# Patient Record
Sex: Female | Born: 1980 | Race: White | Hispanic: No | Marital: Married | Smoking: Never smoker
Health system: Southern US, Community
[De-identification: ages and names within clinical notes are randomized; demographics above are authoritative.]

## PROBLEM LIST (undated history)

## (undated) HISTORY — PX: DILATION AND CURETTAGE OF UTERUS: SHX78

## (undated) HISTORY — PX: CHOLECYSTECTOMY: SHX55

---
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
## 1999-06-21 ENCOUNTER — Other Ambulatory Visit: Admission: RE | Admit: 1999-06-21 | Discharge: 1999-06-21 | Payer: Self-pay | Admitting: Obstetrics and Gynecology

## 2000-07-19 ENCOUNTER — Other Ambulatory Visit: Admission: RE | Admit: 2000-07-19 | Discharge: 2000-07-19 | Payer: Self-pay | Admitting: Obstetrics and Gynecology

## 2000-08-30 ENCOUNTER — Encounter: Admission: RE | Admit: 2000-08-30 | Discharge: 2000-08-30 | Payer: Self-pay | Admitting: Family Medicine

## 2000-08-30 ENCOUNTER — Encounter: Payer: Self-pay | Admitting: Family Medicine

## 2001-07-24 ENCOUNTER — Other Ambulatory Visit: Admission: RE | Admit: 2001-07-24 | Discharge: 2001-07-24 | Payer: Self-pay | Admitting: Obstetrics and Gynecology

## 2002-05-21 ENCOUNTER — Encounter: Admission: RE | Admit: 2002-05-21 | Discharge: 2002-05-21 | Payer: Self-pay | Admitting: Family Medicine

## 2002-05-21 ENCOUNTER — Encounter: Payer: Self-pay | Admitting: Family Medicine

## 2002-07-30 ENCOUNTER — Other Ambulatory Visit: Admission: RE | Admit: 2002-07-30 | Discharge: 2002-07-30 | Payer: Self-pay | Admitting: Obstetrics and Gynecology

## 2003-04-09 ENCOUNTER — Encounter: Admission: RE | Admit: 2003-04-09 | Discharge: 2003-04-09 | Payer: Self-pay | Admitting: Family Medicine

## 2003-07-28 ENCOUNTER — Ambulatory Visit (HOSPITAL_COMMUNITY): Admission: RE | Admit: 2003-07-28 | Discharge: 2003-07-28 | Payer: Self-pay | Admitting: General Surgery

## 2003-09-18 ENCOUNTER — Other Ambulatory Visit: Admission: RE | Admit: 2003-09-18 | Discharge: 2003-09-18 | Payer: Self-pay | Admitting: Obstetrics and Gynecology

## 2004-11-16 ENCOUNTER — Other Ambulatory Visit: Admission: RE | Admit: 2004-11-16 | Discharge: 2004-11-16 | Payer: Self-pay | Admitting: Obstetrics and Gynecology

## 2005-01-30 ENCOUNTER — Ambulatory Visit (HOSPITAL_COMMUNITY): Admission: RE | Admit: 2005-01-30 | Discharge: 2005-01-30 | Payer: Self-pay | Admitting: Obstetrics and Gynecology

## 2005-02-08 ENCOUNTER — Ambulatory Visit (HOSPITAL_COMMUNITY): Admission: AD | Admit: 2005-02-08 | Discharge: 2005-02-08 | Payer: Self-pay | Admitting: Obstetrics and Gynecology

## 2006-02-15 ENCOUNTER — Inpatient Hospital Stay (HOSPITAL_COMMUNITY): Admission: AD | Admit: 2006-02-15 | Discharge: 2006-02-15 | Payer: Self-pay | Admitting: Obstetrics and Gynecology

## 2006-03-12 ENCOUNTER — Inpatient Hospital Stay (HOSPITAL_COMMUNITY): Admission: AD | Admit: 2006-03-12 | Discharge: 2006-03-14 | Payer: Self-pay | Admitting: Obstetrics and Gynecology

## 2010-08-03 ENCOUNTER — Emergency Department (HOSPITAL_BASED_OUTPATIENT_CLINIC_OR_DEPARTMENT_OTHER)
Admission: EM | Admit: 2010-08-03 | Discharge: 2010-08-03 | Disposition: A | Discharge status: Home / Self Care | Payer: Self-pay | Attending: Emergency Medicine | Admitting: Emergency Medicine

## 2010-08-03 DIAGNOSIS — L03317 Cellulitis of buttock: Secondary | ICD-10-CM

## 2010-08-03 DIAGNOSIS — L0231 Cutaneous abscess of buttock: Secondary | ICD-10-CM | POA: Insufficient documentation

## 2010-08-03 MED ORDER — DOXYCYCLINE HYCLATE 100 MG PO CAPS: 100.0000 mg | ORAL_CAPSULE | Freq: Two times a day (BID) | ORAL | Status: AC

## 2010-08-03 MED ORDER — HYDROCODONE-ACETAMINOPHEN 5-325 MG PO TABS: 2.0000 | ORAL_TABLET | ORAL | Status: AC | PRN

## 2010-08-03 NOTE — ED Notes (Signed)
MD at bedside. 

## 2010-08-03 NOTE — ED Notes (Signed)
Lesion noted on right buttock.  Redness, tenderness and drainage is present. Onset 2 days ago.

## 2010-08-03 NOTE — ED Provider Notes (Signed)
History     Chief Complaint  Patient presents with  . Recurrent Skin Infections   HPI Comments: Pain increases with movement and palpation. Patient states she put some warm compresses on it last night and drained following that. Her husband has had MRSA before but she has never had that in the past.  Patient is a 30 y.o. female presenting with abscess. The history is provided by the patient.  Abscess  This is a new problem. Episode onset: two days ago. The onset was gradual. The problem has been gradually worsening. The abscess is present on the right buttock. The problem is moderate. The abscess is characterized by painfulness. Pertinent negatives include no anorexia, no fever and no diarrhea. There were no sick contacts.    No past medical history on file.  Past Surgical History  Procedure Date  . Cholecystectomy   . Dilation and curettage of uterus     No family history on file.  History  Substance Use Topics  . Smoking status: Never Smoker   . Smokeless tobacco: Not on file  . Alcohol Use: No    OB History    Grav Para Term Preterm Abortions TAB SAB Ect Mult Living                  Review of Systems  Constitutional: Negative for fever.  Gastrointestinal: Negative for diarrhea and anorexia.    Physical Exam  BP 118/70  Pulse 73  Temp(Src) 98.5 F (36.9 C) (Oral)  Resp 20  Ht 5\' 7"  (1.702 m)  Wt 280 lb (127.007 kg)  BMI 43.85 kg/m2  SpO2 99%  Physical Exam  Constitutional: She appears well-developed and well-nourished. No distress.  HENT:  Head: Normocephalic and atraumatic.  Right Ear: External ear normal.  Left Ear: External ear normal.  Eyes: Conjunctivae are normal. Right eye exhibits no discharge. Left eye exhibits no discharge. No scleral icterus.  Neck: Neck supple. No tracheal deviation present.  Pulmonary/Chest: Effort normal. No stridor. No respiratory distress.  Musculoskeletal: She exhibits no edema.       Right inferior buttock there is an  area of erythema and induration. Posterior one 2 cm in size. Small scab centrally. No active drainage.  Neurological: She is alert. Cranial nerve deficit: no gross deficits.  Skin: Skin is warm and dry. No rash noted.  Psychiatric: She has a normal mood and affect.    ED Course  Procedures Brief bedside ED ultrasound was performed and there was no evidence of fluid collection subcutaneously near the area of induration of the skin. No drainable abscess noted. MDM Patient does have a small area that was likely a small abscess however started spontaneously drained. We'll have her continue warm compresses and will give a prescription for doxycycline. We'll have her followup with her primary doctor if the symptoms persist in 2 days.      Celene Kras, MD 08/03/10 1200

## 2010-08-03 NOTE — ED Notes (Signed)
Open area noted to right buttock that started 2 days ago.  Site is painful to touch, draining purulent drainage and induration present.

## 2011-04-06 ENCOUNTER — Other Ambulatory Visit: Payer: Self-pay | Admitting: Obstetrics and Gynecology

## 2013-11-06 ENCOUNTER — Other Ambulatory Visit: Payer: Self-pay | Admitting: Family Medicine

## 2013-11-06 ENCOUNTER — Ambulatory Visit
Admission: RE | Admit: 2013-11-06 | Discharge: 2013-11-06 | Disposition: A | Discharge status: Home / Self Care | Payer: 59 | Source: Ambulatory Visit | Attending: Family Medicine | Admitting: Family Medicine

## 2013-11-06 DIAGNOSIS — M79605 Pain in left leg: Secondary | ICD-10-CM

## 2014-05-13 ENCOUNTER — Other Ambulatory Visit: Payer: Self-pay | Admitting: Obstetrics and Gynecology

## 2014-05-14 LAB — CYTOLOGY - PAP

## 2015-04-01 DIAGNOSIS — M545 Low back pain: Secondary | ICD-10-CM | POA: Diagnosis not present

## 2015-05-17 DIAGNOSIS — G8929 Other chronic pain: Secondary | ICD-10-CM | POA: Diagnosis not present

## 2015-05-17 DIAGNOSIS — M5136 Other intervertebral disc degeneration, lumbar region: Secondary | ICD-10-CM | POA: Diagnosis not present

## 2015-05-17 DIAGNOSIS — M5442 Lumbago with sciatica, left side: Secondary | ICD-10-CM | POA: Diagnosis not present

## 2015-05-17 MED FILL — predniSONE 50 MG TABS: 50 | 7 days supply | Qty: 7 | Fill #0

## 2015-09-21 DIAGNOSIS — Z01411 Encounter for gynecological examination (general) (routine) with abnormal findings: Secondary | ICD-10-CM | POA: Diagnosis not present

## 2015-09-21 DIAGNOSIS — Z780 Asymptomatic menopausal state: Secondary | ICD-10-CM | POA: Diagnosis not present

## 2015-09-21 DIAGNOSIS — Z6841 Body Mass Index (BMI) 40.0 and over, adult: Secondary | ICD-10-CM | POA: Diagnosis not present

## 2015-09-21 DIAGNOSIS — L639 Alopecia areata, unspecified: Secondary | ICD-10-CM | POA: Diagnosis not present

## 2015-09-21 DIAGNOSIS — Z01419 Encounter for gynecological examination (general) (routine) without abnormal findings: Secondary | ICD-10-CM | POA: Diagnosis not present

## 2015-09-21 DIAGNOSIS — N920 Excessive and frequent menstruation with regular cycle: Secondary | ICD-10-CM | POA: Diagnosis not present

## 2015-10-11 MED FILL — MONO-LINYAH 28 TABLET: 0.25-35 | 84 days supply | Qty: 84 | Fill #0

## 2016-06-13 DIAGNOSIS — J209 Acute bronchitis, unspecified: Secondary | ICD-10-CM | POA: Diagnosis not present

## 2016-06-13 DIAGNOSIS — R062 Wheezing: Secondary | ICD-10-CM | POA: Diagnosis not present

## 2016-06-13 DIAGNOSIS — J069 Acute upper respiratory infection, unspecified: Secondary | ICD-10-CM | POA: Diagnosis not present

## 2016-06-14 MED FILL — AMOX TR-K CLV 875-125 MG TA: 875-125 | 10 days supply | Qty: 20 | Fill #0

## 2016-06-26 MED FILL — CYCLOBENZAPRINE 10 MG TABLE: 10 | 10 days supply | Qty: 30 | Fill #0

## 2016-07-18 DIAGNOSIS — L03116 Cellulitis of left lower limb: Secondary | ICD-10-CM | POA: Diagnosis not present

## 2016-07-18 MED FILL — DOXYCYCLINE HYC 100 MG TAB: 100 | 7 days supply | Qty: 14 | Fill #0

## 2019-08-05 ENCOUNTER — Other Ambulatory Visit: Payer: Self-pay

## 2019-08-05 ENCOUNTER — Inpatient Hospital Stay (HOSPITAL_COMMUNITY): Payer: Medicaid Other | Admitting: Certified Registered Nurse Anesthetist

## 2019-08-05 ENCOUNTER — Emergency Department (HOSPITAL_COMMUNITY): Payer: Medicaid Other

## 2019-08-05 ENCOUNTER — Inpatient Hospital Stay (HOSPITAL_COMMUNITY): Payer: Medicaid Other

## 2019-08-05 ENCOUNTER — Observation Stay (HOSPITAL_COMMUNITY)
Admission: EM | Admit: 2019-08-05 | Discharge: 2019-08-06 | Disposition: A | Discharge status: Home / Self Care | Payer: Medicaid Other | Attending: Neurosurgery | Admitting: Neurosurgery

## 2019-08-05 ENCOUNTER — Encounter (HOSPITAL_COMMUNITY)
Admission: EM | Disposition: A | Discharge status: Home / Self Care | Payer: Self-pay | Source: Home / Self Care | Attending: Emergency Medicine

## 2019-08-05 ENCOUNTER — Encounter (HOSPITAL_COMMUNITY): Payer: Self-pay | Admitting: Neurosurgery

## 2019-08-05 DIAGNOSIS — M5126 Other intervertebral disc displacement, lumbar region: Secondary | ICD-10-CM | POA: Diagnosis not present

## 2019-08-05 DIAGNOSIS — Z20822 Contact with and (suspected) exposure to covid-19: Secondary | ICD-10-CM | POA: Diagnosis not present

## 2019-08-05 DIAGNOSIS — Z981 Arthrodesis status: Secondary | ICD-10-CM | POA: Diagnosis not present

## 2019-08-05 DIAGNOSIS — M48061 Spinal stenosis, lumbar region without neurogenic claudication: Secondary | ICD-10-CM | POA: Diagnosis not present

## 2019-08-05 DIAGNOSIS — G8929 Other chronic pain: Secondary | ICD-10-CM | POA: Diagnosis not present

## 2019-08-05 DIAGNOSIS — R52 Pain, unspecified: Secondary | ICD-10-CM | POA: Diagnosis present

## 2019-08-05 DIAGNOSIS — Z79899 Other long term (current) drug therapy: Secondary | ICD-10-CM | POA: Diagnosis not present

## 2019-08-05 DIAGNOSIS — M545 Low back pain: Secondary | ICD-10-CM | POA: Diagnosis not present

## 2019-08-05 DIAGNOSIS — Z419 Encounter for procedure for purposes other than remedying health state, unspecified: Secondary | ICD-10-CM

## 2019-08-05 HISTORY — PX: LUMBAR LAMINECTOMY/DECOMPRESSION MICRODISCECTOMY: SHX5026

## 2019-08-05 LAB — URINALYSIS, ROUTINE W REFLEX MICROSCOPIC
Bilirubin Urine: NEGATIVE
Glucose, UA: NEGATIVE mg/dL
Hgb urine dipstick: NEGATIVE
Ketones, ur: NEGATIVE mg/dL
Nitrite: NEGATIVE
Protein, ur: NEGATIVE mg/dL
Specific Gravity, Urine: 1.027 (ref 1.005–1.030)
pH: 5 (ref 5.0–8.0)

## 2019-08-05 LAB — CBC WITH DIFFERENTIAL/PLATELET
Abs Immature Granulocytes: 0.04 10*3/uL (ref 0.00–0.07)
Basophils Absolute: 0 10*3/uL (ref 0.0–0.1)
Basophils Relative: 0 %
Eosinophils Absolute: 0 10*3/uL (ref 0.0–0.5)
Eosinophils Relative: 0 %
HCT: 36.1 % (ref 36.0–46.0)
Hemoglobin: 11.5 g/dL — ABNORMAL LOW (ref 12.0–15.0)
Immature Granulocytes: 0 %
Lymphocytes Relative: 7 %
Lymphs Abs: 0.8 10*3/uL (ref 0.7–4.0)
MCH: 29.8 pg (ref 26.0–34.0)
MCHC: 31.9 g/dL (ref 30.0–36.0)
MCV: 93.5 fL (ref 80.0–100.0)
Monocytes Absolute: 0.2 10*3/uL (ref 0.1–1.0)
Monocytes Relative: 1 %
Neutro Abs: 10 10*3/uL — ABNORMAL HIGH (ref 1.7–7.7)
Neutrophils Relative %: 92 %
Platelets: 286 10*3/uL (ref 150–400)
RBC: 3.86 MIL/uL — ABNORMAL LOW (ref 3.87–5.11)
RDW: 14.8 % (ref 11.5–15.5)
WBC: 11 10*3/uL — ABNORMAL HIGH (ref 4.0–10.5)
nRBC: 0 % (ref 0.0–0.2)

## 2019-08-05 LAB — SARS CORONAVIRUS 2 BY RT PCR (HOSPITAL ORDER, PERFORMED IN ~~LOC~~ HOSPITAL LAB): SARS Coronavirus 2: NEGATIVE

## 2019-08-05 LAB — BASIC METABOLIC PANEL
Anion gap: 7 (ref 5–15)
BUN: 12 mg/dL (ref 6–20)
CO2: 26 mmol/L (ref 22–32)
Calcium: 8.6 mg/dL — ABNORMAL LOW (ref 8.9–10.3)
Chloride: 103 mmol/L (ref 98–111)
Creatinine, Ser: 0.55 mg/dL (ref 0.44–1.00)
GFR calc Af Amer: >60 mL/min (ref >60)
GFR calc non Af Amer: >60 mL/min (ref >60)
Glucose, Bld: 107 mg/dL — ABNORMAL HIGH (ref 70–99)
Potassium: 3.7 mmol/L (ref 3.5–5.1)
Sodium: 136 mmol/L (ref 135–145)

## 2019-08-05 SURGERY — LUMBAR LAMINECTOMY/DECOMPRESSION MICRODISCECTOMY 1 LEVEL
Anesthesia: General | Site: Back

## 2019-08-05 MED ORDER — 0.9 % SODIUM CHLORIDE (POUR BTL) OPTIME
TOPICAL | Status: DC | PRN
  Administered 2019-08-05: 1000 mL

## 2019-08-05 MED ORDER — FENTANYL CITRATE (PF) 100 MCG/2ML IJ SOLN: 25.0000 ug | INTRAMUSCULAR | Status: DC | PRN

## 2019-08-05 MED ORDER — METHYLPREDNISOLONE ACETATE 80 MG/ML IJ SUSP
INTRAMUSCULAR | Status: DC | PRN
  Administered 2019-08-05: 80 mg

## 2019-08-05 MED ORDER — METHYLPREDNISOLONE ACETATE 80 MG/ML IJ SUSP
INTRAMUSCULAR | Status: AC
  Filled 2019-08-05: qty 1

## 2019-08-05 MED ORDER — BISACODYL 10 MG RE SUPP: 10.0000 mg | Freq: Every day | RECTAL | Status: DC | PRN

## 2019-08-05 MED ORDER — ALBUMIN HUMAN 5 % IV SOLN
INTRAVENOUS | Status: DC | PRN
Start: 2019-08-05 — End: 2019-08-05

## 2019-08-05 MED ORDER — BUPIVACAINE HCL (PF) 0.5 % IJ SOLN
INTRAMUSCULAR | Status: AC
  Filled 2019-08-05: qty 30

## 2019-08-05 MED ORDER — DIPHENHYDRAMINE HCL 50 MG/ML IJ SOLN
INTRAMUSCULAR | Status: DC | PRN
  Administered 2019-08-05: 6.25 mg via INTRAVENOUS

## 2019-08-05 MED ORDER — ACETAMINOPHEN 10 MG/ML IV SOLN
INTRAVENOUS | Status: DC | PRN
Start: 2019-08-05 — End: 2019-08-05
  Administered 2019-08-05: 1000 mg via INTRAVENOUS

## 2019-08-05 MED ORDER — DIAZEPAM 2 MG PO TABS
2.0000 mg | ORAL_TABLET | Freq: Once | ORAL | Status: AC
  Administered 2019-08-05: 2 mg via ORAL
  Filled 2019-08-05: qty 1

## 2019-08-05 MED ORDER — LIDOCAINE-EPINEPHRINE 1 %-1:100000 IJ SOLN
INTRAMUSCULAR | Status: DC | PRN
  Administered 2019-08-05: 5 mL via INTRADERMAL

## 2019-08-05 MED ORDER — SUGAMMADEX SODIUM 500 MG/5ML IV SOLN
INTRAVENOUS | Status: DC | PRN
  Administered 2019-08-05: 300 mg via INTRAVENOUS

## 2019-08-05 MED ORDER — METHYLPREDNISOLONE SODIUM SUCC 125 MG IJ SOLR
125.0000 mg | Freq: Once | INTRAMUSCULAR | Status: AC
  Administered 2019-08-05: 125 mg via INTRAVENOUS

## 2019-08-05 MED ORDER — FENTANYL CITRATE (PF) 100 MCG/2ML IJ SOLN
100.0000 ug | Freq: Once | INTRAMUSCULAR | Status: AC
  Administered 2019-08-05: 100 ug via INTRAVENOUS

## 2019-08-05 MED ORDER — DEXTROSE 5 % IV SOLN
3.0000 g | INTRAVENOUS | Status: DC
  Filled 2019-08-05 (×2): qty 3000

## 2019-08-05 MED ORDER — ACETAMINOPHEN 650 MG RE SUPP: 650.0000 mg | RECTAL | Status: DC | PRN

## 2019-08-05 MED ORDER — GABAPENTIN 300 MG PO CAPS
300.0000 mg | ORAL_CAPSULE | Freq: Three times a day (TID) | ORAL | Status: DC
  Administered 2019-08-06: 300 mg via ORAL
  Filled 2019-08-05: qty 1

## 2019-08-05 MED ORDER — ALBUTEROL SULFATE HFA 108 (90 BASE) MCG/ACT IN AERS
INHALATION_SPRAY | RESPIRATORY_TRACT | Status: DC | PRN
Start: 2019-08-05 — End: 2019-08-05
  Administered 2019-08-05 (×2): 4 via RESPIRATORY_TRACT

## 2019-08-05 MED ORDER — OXYCODONE-ACETAMINOPHEN 5-325 MG PO TABS
1.0000 | ORAL_TABLET | Freq: Once | ORAL | Status: AC
  Administered 2019-08-05: 1 via ORAL
  Filled 2019-08-05: qty 1

## 2019-08-05 MED ORDER — LIDOCAINE-EPINEPHRINE 1 %-1:100000 IJ SOLN
INTRAMUSCULAR | Status: AC
  Filled 2019-08-05: qty 1

## 2019-08-05 MED ORDER — METHOCARBAMOL 500 MG PO TABS
500.0000 mg | ORAL_TABLET | Freq: Once | ORAL | Status: AC
  Administered 2019-08-05: 500 mg via ORAL

## 2019-08-05 MED ORDER — MIDAZOLAM HCL 5 MG/5ML IJ SOLN
INTRAMUSCULAR | Status: DC | PRN
  Administered 2019-08-05: 2 mg via INTRAVENOUS

## 2019-08-05 MED ORDER — BACITRACIN ZINC 500 UNIT/GM EX OINT
TOPICAL_OINTMENT | CUTANEOUS | Status: AC
  Filled 2019-08-05: qty 28.35

## 2019-08-05 MED ORDER — DEXTROSE 5 % IV SOLN
INTRAVENOUS | Status: DC | PRN
  Administered 2019-08-05: 3 g via INTRAVENOUS

## 2019-08-05 MED ORDER — FENTANYL CITRATE (PF) 100 MCG/2ML IJ SOLN
INTRAMUSCULAR | Status: AC
  Administered 2019-08-05: 100 ug via INTRAVENOUS
  Filled 2019-08-05: qty 2

## 2019-08-05 MED ORDER — ONDANSETRON HCL 4 MG PO TABS: 4.0000 mg | ORAL_TABLET | Freq: Four times a day (QID) | ORAL | Status: DC | PRN

## 2019-08-05 MED ORDER — DOCUSATE SODIUM 100 MG PO CAPS
100.0000 mg | ORAL_CAPSULE | Freq: Two times a day (BID) | ORAL | Status: DC
  Administered 2019-08-06: 100 mg via ORAL
  Filled 2019-08-05: qty 1

## 2019-08-05 MED ORDER — MENTHOL 3 MG MT LOZG: 1.0000 | LOZENGE | OROMUCOSAL | Status: DC | PRN

## 2019-08-05 MED ORDER — SODIUM CHLORIDE 0.9 % IV SOLN: 250.0000 mL | INTRAVENOUS | Status: DC

## 2019-08-05 MED ORDER — FENTANYL CITRATE (PF) 250 MCG/5ML IJ SOLN
INTRAMUSCULAR | Status: DC | PRN
  Administered 2019-08-05: 150 ug via INTRAVENOUS
  Administered 2019-08-05 (×4): 50 ug via INTRAVENOUS

## 2019-08-05 MED ORDER — HYDROCODONE-ACETAMINOPHEN 5-325 MG PO TABS: 1.0000 | ORAL_TABLET | ORAL | Status: DC | PRN

## 2019-08-05 MED ORDER — ACETAMINOPHEN 325 MG PO TABS: 650.0000 mg | ORAL_TABLET | ORAL | Status: DC | PRN

## 2019-08-05 MED ORDER — METHOCARBAMOL 500 MG PO TABS
500.0000 mg | ORAL_TABLET | Freq: Four times a day (QID) | ORAL | Status: DC | PRN
  Administered 2019-08-06: 500 mg via ORAL
  Filled 2019-08-05 (×2): qty 1

## 2019-08-05 MED ORDER — FENTANYL CITRATE (PF) 100 MCG/2ML IJ SOLN
50.0000 ug | INTRAMUSCULAR | Status: DC | PRN
  Administered 2019-08-05: 50 ug via INTRAVENOUS
  Filled 2019-08-05: qty 2

## 2019-08-05 MED ORDER — ONDANSETRON HCL 4 MG/2ML IJ SOLN: 4.0000 mg | Freq: Four times a day (QID) | INTRAMUSCULAR | Status: DC | PRN

## 2019-08-05 MED ORDER — METHYLPREDNISOLONE SODIUM SUCC 125 MG IJ SOLR
INTRAMUSCULAR | Status: AC
  Administered 2019-08-05: 125 mg via INTRAVENOUS
  Filled 2019-08-05: qty 2

## 2019-08-05 MED ORDER — PHENOL 1.4 % MT LIQD: 1.0000 | OROMUCOSAL | Status: DC | PRN

## 2019-08-05 MED ORDER — SODIUM CHLORIDE 0.9% FLUSH: 3.0000 mL | Freq: Two times a day (BID) | INTRAVENOUS | Status: DC

## 2019-08-05 MED ORDER — METHOCARBAMOL 1000 MG/10ML IJ SOLN
500.0000 mg | Freq: Four times a day (QID) | INTRAVENOUS | Status: DC | PRN
  Filled 2019-08-05: qty 5

## 2019-08-05 MED ORDER — LORAZEPAM 2 MG/ML IJ SOLN
1.0000 mg | Freq: Once | INTRAMUSCULAR | Status: AC
Start: 2019-08-05 — End: 2019-08-05
  Administered 2019-08-05: 1 mg via INTRAVENOUS
  Filled 2019-08-05: qty 1

## 2019-08-05 MED ORDER — THROMBIN 5000 UNITS EX SOLR
OROMUCOSAL | Status: DC | PRN
  Administered 2019-08-05: 5 mL via TOPICAL

## 2019-08-05 MED ORDER — PROPOFOL 10 MG/ML IV BOLUS
INTRAVENOUS | Status: DC | PRN
  Administered 2019-08-05: 200 mg via INTRAVENOUS

## 2019-08-05 MED ORDER — AMISULPRIDE (ANTIEMETIC) 5 MG/2ML IV SOLN: 10.0000 mg | Freq: Once | INTRAVENOUS | Status: DC | PRN

## 2019-08-05 MED ORDER — ROCURONIUM BROMIDE 10 MG/ML (PF) SYRINGE
PREFILLED_SYRINGE | INTRAVENOUS | Status: DC | PRN
  Administered 2019-08-05: 60 mg via INTRAVENOUS
  Administered 2019-08-05: 10 mg via INTRAVENOUS

## 2019-08-05 MED ORDER — SODIUM CHLORIDE 0.9% FLUSH: 3.0000 mL | INTRAVENOUS | Status: DC | PRN

## 2019-08-05 MED ORDER — CEFAZOLIN SODIUM-DEXTROSE 2-4 GM/100ML-% IV SOLN
2.0000 g | Freq: Three times a day (TID) | INTRAVENOUS | Status: DC
  Administered 2019-08-06: 2 g via INTRAVENOUS
  Filled 2019-08-05: qty 100

## 2019-08-05 MED ORDER — LIDOCAINE 2% (20 MG/ML) 5 ML SYRINGE
INTRAMUSCULAR | Status: DC | PRN
  Administered 2019-08-05: 60 mg via INTRAVENOUS

## 2019-08-05 MED ORDER — SENNA 8.6 MG PO TABS
1.0000 | ORAL_TABLET | Freq: Two times a day (BID) | ORAL | Status: DC
  Administered 2019-08-06: 8.6 mg via ORAL
  Filled 2019-08-05: qty 1

## 2019-08-05 MED ORDER — PHENYLEPHRINE HCL-NACL 10-0.9 MG/250ML-% IV SOLN
INTRAVENOUS | Status: DC | PRN
Start: 2019-08-05 — End: 2019-08-05
  Administered 2019-08-05: 25 ug/min via INTRAVENOUS

## 2019-08-05 MED ORDER — LACTATED RINGERS IV SOLN
INTRAVENOUS | Status: DC | PRN
Start: 2019-08-05 — End: 2019-08-05

## 2019-08-05 MED ORDER — SODIUM CHLORIDE 0.9 % IV SOLN: INTRAVENOUS | Status: DC

## 2019-08-05 MED ORDER — ACETAMINOPHEN 500 MG PO TABS
1000.0000 mg | ORAL_TABLET | Freq: Four times a day (QID) | ORAL | Status: DC
  Administered 2019-08-06 (×2): 1000 mg via ORAL
  Filled 2019-08-05 (×2): qty 2

## 2019-08-05 MED ORDER — PHENYLEPHRINE 40 MCG/ML (10ML) SYRINGE FOR IV PUSH (FOR BLOOD PRESSURE SUPPORT)
PREFILLED_SYRINGE | INTRAVENOUS | Status: DC | PRN
  Administered 2019-08-05 (×4): 80 ug via INTRAVENOUS

## 2019-08-05 MED ORDER — THROMBIN 5000 UNITS EX SOLR
CUTANEOUS | Status: AC
  Filled 2019-08-05: qty 5000

## 2019-08-05 MED ORDER — LACTATED RINGERS IV SOLN: INTRAVENOUS | Status: DC

## 2019-08-05 MED ORDER — DEXAMETHASONE SODIUM PHOSPHATE 10 MG/ML IJ SOLN
INTRAMUSCULAR | Status: DC | PRN
  Administered 2019-08-05: 10 mg via INTRAVENOUS

## 2019-08-05 MED ORDER — HYDROMORPHONE HCL 1 MG/ML IJ SOLN: 0.5000 mg | INTRAMUSCULAR | Status: DC | PRN

## 2019-08-05 MED ORDER — LACTATED RINGERS IV SOLN: INTRAVENOUS | Status: DC | PRN

## 2019-08-05 MED ORDER — BUPIVACAINE HCL (PF) 0.5 % IJ SOLN
INTRAMUSCULAR | Status: DC | PRN
  Administered 2019-08-05: 5 mL

## 2019-08-05 MED ORDER — FLEET ENEMA 7-19 GM/118ML RE ENEM: 1.0000 | ENEMA | Freq: Once | RECTAL | Status: DC | PRN

## 2019-08-05 MED ORDER — GABAPENTIN 300 MG PO CAPS
300.0000 mg | ORAL_CAPSULE | Freq: Once | ORAL | Status: AC
  Administered 2019-08-05: 300 mg via ORAL
  Filled 2019-08-05: qty 1

## 2019-08-05 MED ORDER — OXYCODONE HCL 5 MG PO TABS
5.0000 mg | ORAL_TABLET | ORAL | Status: DC | PRN
  Administered 2019-08-06: 5 mg via ORAL
  Filled 2019-08-05: qty 2

## 2019-08-05 MED ORDER — ONDANSETRON HCL 4 MG/2ML IJ SOLN
INTRAMUSCULAR | Status: DC | PRN
  Administered 2019-08-05: 4 mg via INTRAVENOUS

## 2019-08-05 MED ORDER — SODIUM CHLORIDE 0.9 % IV SOLN
INTRAVENOUS | Status: DC | PRN
  Administered 2019-08-05: 500 mL

## 2019-08-05 MED ORDER — METHOCARBAMOL 500 MG PO TABS
ORAL_TABLET | ORAL | Status: AC
  Administered 2019-08-05: 500 mg via ORAL
  Filled 2019-08-05: qty 1

## 2019-08-05 MED ORDER — SENNOSIDES-DOCUSATE SODIUM 8.6-50 MG PO TABS: 1.0000 | ORAL_TABLET | Freq: Every evening | ORAL | Status: DC | PRN

## 2019-08-05 MED ORDER — HYDROMORPHONE HCL 1 MG/ML IJ SOLN
1.0000 mg | Freq: Once | INTRAMUSCULAR | Status: AC
  Administered 2019-08-05: 1 mg via INTRAVENOUS
  Filled 2019-08-05: qty 1

## 2019-08-05 MED ORDER — MORPHINE SULFATE (PF) 4 MG/ML IV SOLN
4.0000 mg | Freq: Once | INTRAVENOUS | Status: AC
  Administered 2019-08-05: 4 mg via INTRAVENOUS
  Filled 2019-08-05: qty 1

## 2019-08-05 SURGICAL SUPPLY — 64 items
ADH SKN CLS APL DERMABOND .7 (GAUZE/BANDAGES/DRESSINGS) ×1
APL SKNCLS STERI-STRIP NONHPOA (GAUZE/BANDAGES/DRESSINGS)
BAG DECANTER FOR FLEXI CONT (MISCELLANEOUS) ×3 IMPLANT
BAND INSRT 18 STRL LF DISP RB (MISCELLANEOUS) ×2
BAND RUBBER #18 3X1/16 STRL (MISCELLANEOUS) ×6 IMPLANT
BENZOIN TINCTURE PRP APPL 2/3 (GAUZE/BANDAGES/DRESSINGS) IMPLANT
BLADE CLIPPER SURG (BLADE) IMPLANT
BLADE SURG 11 STRL SS (BLADE) ×5 IMPLANT
BUR MATCHSTICK NEURO 3.0 LAGG (BURR) IMPLANT
BUR PRECISION FLUTE 5.0 (BURR) IMPLANT
CANISTER SUCT 3000ML PPV (MISCELLANEOUS) ×3 IMPLANT
CARTRIDGE OIL MAESTRO DRILL (MISCELLANEOUS) ×1 IMPLANT
CLOSURE WOUND 1/2 X4 (GAUZE/BANDAGES/DRESSINGS)
COVER WAND RF STERILE (DRAPES) ×3 IMPLANT
DECANTER SPIKE VIAL GLASS SM (MISCELLANEOUS) ×3 IMPLANT
DERMABOND ADVANCED (GAUZE/BANDAGES/DRESSINGS) ×2
DERMABOND ADVANCED .7 DNX12 (GAUZE/BANDAGES/DRESSINGS) ×1 IMPLANT
DIFFUSER DRILL AIR PNEUMATIC (MISCELLANEOUS) ×3 IMPLANT
DRAPE LAPAROTOMY 100X72X124 (DRAPES) ×3 IMPLANT
DRAPE MICROSCOPE LEICA (MISCELLANEOUS) ×3 IMPLANT
DRAPE SURG 17X23 STRL (DRAPES) ×3 IMPLANT
DRSG OPSITE POSTOP 3X4 (GAUZE/BANDAGES/DRESSINGS) ×3 IMPLANT
DURAPREP 26ML APPLICATOR (WOUND CARE) ×3 IMPLANT
ELECT REM PT RETURN 9FT ADLT (ELECTROSURGICAL) ×3
ELECTRODE REM PT RTRN 9FT ADLT (ELECTROSURGICAL) ×1 IMPLANT
GAUZE 4X4 16PLY RFD (DISPOSABLE) IMPLANT
GAUZE SPONGE 4X4 12PLY STRL (GAUZE/BANDAGES/DRESSINGS) IMPLANT
GLOVE BIO SURGEON STRL SZ 6.5 (GLOVE) ×1 IMPLANT
GLOVE BIO SURGEON STRL SZ7 (GLOVE) ×2 IMPLANT
GLOVE BIO SURGEON STRL SZ7.5 (GLOVE) IMPLANT
GLOVE BIO SURGEONS STRL SZ 6.5 (GLOVE) ×1
GLOVE BIOGEL PI IND STRL 7.5 (GLOVE) ×2 IMPLANT
GLOVE BIOGEL PI INDICATOR 7.5 (GLOVE) ×4
GLOVE ECLIPSE 7.0 STRL STRAW (GLOVE) ×3 IMPLANT
GLOVE EXAM NITRILE XL STR (GLOVE) IMPLANT
GOWN STRL REUS W/ TWL LRG LVL3 (GOWN DISPOSABLE) ×2 IMPLANT
GOWN STRL REUS W/ TWL XL LVL3 (GOWN DISPOSABLE) IMPLANT
GOWN STRL REUS W/TWL 2XL LVL3 (GOWN DISPOSABLE) IMPLANT
GOWN STRL REUS W/TWL LRG LVL3 (GOWN DISPOSABLE) ×6
GOWN STRL REUS W/TWL XL LVL3 (GOWN DISPOSABLE)
HEMOSTAT POWDER KIT SURGIFOAM (HEMOSTASIS) ×3 IMPLANT
KIT BASIN OR (CUSTOM PROCEDURE TRAY) ×3 IMPLANT
KIT TURNOVER KIT B (KITS) ×3 IMPLANT
NDL HYPO 18GX1.5 BLUNT FILL (NEEDLE) IMPLANT
NDL SPNL 18GX3.5 QUINCKE PK (NEEDLE) IMPLANT
NEEDLE HYPO 18GX1.5 BLUNT FILL (NEEDLE) ×3 IMPLANT
NEEDLE HYPO 22GX1.5 SAFETY (NEEDLE) ×3 IMPLANT
NEEDLE SPNL 18GX3.5 QUINCKE PK (NEEDLE) ×3 IMPLANT
NS IRRIG 1000ML POUR BTL (IV SOLUTION) ×3 IMPLANT
OIL CARTRIDGE MAESTRO DRILL (MISCELLANEOUS) ×3
PACK LAMINECTOMY NEURO (CUSTOM PROCEDURE TRAY) ×3 IMPLANT
PAD ARMBOARD 7.5X6 YLW CONV (MISCELLANEOUS) ×9 IMPLANT
SPONGE LAP 4X18 RFD (DISPOSABLE) IMPLANT
SPONGE SURGIFOAM ABS GEL SZ50 (HEMOSTASIS) ×3 IMPLANT
STRIP CLOSURE SKIN 1/2X4 (GAUZE/BANDAGES/DRESSINGS) IMPLANT
SUT VIC AB 0 CT1 18XCR BRD8 (SUTURE) ×1 IMPLANT
SUT VIC AB 0 CT1 8-18 (SUTURE) ×3
SUT VIC AB 2-0 CT1 18 (SUTURE) IMPLANT
SUT VIC AB 3-0 SH 8-18 (SUTURE) ×2 IMPLANT
SUT VICRYL 3-0 RB1 18 ABS (SUTURE) ×4 IMPLANT
SYR 3ML LL SCALE MARK (SYRINGE) IMPLANT
TOWEL GREEN STERILE (TOWEL DISPOSABLE) ×3 IMPLANT
TOWEL GREEN STERILE FF (TOWEL DISPOSABLE) ×3 IMPLANT
WATER STERILE IRR 1000ML POUR (IV SOLUTION) ×3 IMPLANT

## 2019-08-05 NOTE — ED Notes (Signed)
Off floor to MRI

## 2019-08-05 NOTE — H&P (Signed)
Chief Complaint   Chief Complaint  Patient presents with  . Back Pain    HPI   Consult requested by: Dr Stevie Kernykstra, EDP [facility] Reason for consult: Lumbar HNP  HPI: Jenna Galvan is a 39 y.o. female with no known past medical history who presented to the ED with worsening LBP. She has a relatively chronic history of lower back pain that has been progressively worsening over the last 2 months.   In addition to the back pain she has started to developed left leg pain.  Pain radiates down laterally to her foot.  She has numbness in her leg however this has been present for at least 6 months now.  She has been treating her symptoms conservatively over the last several months and has been on multiple medications including steroids, muscle relaxers and anti-inflammatories.  She has also been seeing a physical therapist with minimal to no relief.  She presented to the emergency room and an MRI of her lumbar spine was ordered revealing a large central disc herniation at L4-5.  We were called for further recommendations and admission.  Upon further questioning,  Patient does have some mild right leg pain at times although is nowhere near as severe as the left leg.   She has also noticed some weakness is in her feet particularly the left.  Feels as though her foot is flopping at times. Starting this morning she has started to notice some difficulties with urination.  She feels as though she is not emptying all the way at times and she is having difficulty starting her stream. No incontinence episodes.   She has urinated since being in the emergency department.   She is not on any blood thinning medications. Last meal: 1800 yesterday   Patient Active Problem List   Diagnosis Date Noted  . Intractable pain 08/05/2019    PMH: No past medical history on file.  PSH: (Not in a hospital admission)   SH: Social History   Tobacco Use  . Smoking status: Never Smoker  Substance Use Topics  . Alcohol  use: No  . Drug use: No    MEDS: Prior to Admission medications   Medication Sig Start Date End Date Taking? Authorizing Provider  acetaminophen (TYLENOL) 500 MG tablet Take 1,000 mg by mouth every 6 (six) hours as needed for mild pain or headache.   Yes [provider]  cyclobenzaprine (FLEXERIL) 10 MG tablet Take 10 mg by mouth 3 (three) times daily as needed for muscle spasms.   Yes [provider]  ibuprofen (ADVIL) 800 MG tablet Take 800 mg by mouth every 8 (eight) hours as needed for headache or mild pain.   Yes [provider]    ALLERGY: No Known Allergies  Social History   Tobacco Use  . Smoking status: Never Smoker  Substance Use Topics  . Alcohol use: No     No family history on file.   ROS   Review of Systems  Musculoskeletal: Positive for back pain and myalgias. Negative for falls and neck pain.  Neurological: Positive for weakness. Negative for dizziness, tingling, tremors, sensory change, speech change, seizures, loss of consciousness and headaches.  Endo/Heme/Allergies: Negative.     Exam   Vitals:   08/05/19 1630 08/05/19 1700  BP: (!) 146/89 (!) 145/90  Pulse: 88 80  Resp: 16 18  Temp:    SpO2: 100% 96%   General appearance: WDWN, NAD Eyes: No scleral injection Cardiovascular: Regular rate and  rhythm without murmurs, rubs, gallops. No edema or variciosities. Distal pulses normal. Pulmonary: Effort normal, non-labored breathing Musculoskeletal:     Muscle tone upper extremities: Normal    Muscle tone lower extremities: Normal    Motor exam: Upper Extremities Deltoid Bicep Tricep Grip  Right 5/5 5/5 5/5 5/5  Left 5/5 5/5 5/5 5/5   Lower Extremity IP Quad PF DF EHL  Right 5/5 5/5 5/5 4+/5 5/5  Left 5/5 5/5 5/5 4/5 5/5   Neurological Mental Status:    - Patient is awake, alert, oriented to person, place, month, year, and situation    - Patient is able to give a clear and coherent history.    - No signs of aphasia  or neglect Cranial Nerves    - II: Visual Fields are full. PERRL    - III/IV/VI: EOMI without ptosis or diploplia.     - V: Facial sensation is grossly normal    - VII: Facial movement is symmetric.     - VIII: hearing is intact to voice    - X: Uvula elevates symmetrically    - XI: Shoulder shrug is symmetric.    - XII: tongue is midline without atrophy or fasciculations.  Sensory: Sensation grossly intact to LT  Results - Imaging/Labs   Results for orders placed or performed during the hospital encounter of 08/05/19 (from the past 48 hour(s))  Urinalysis, Routine w reflex microscopic     Status: Abnormal   Collection Time: 08/05/19 10:20 AM  Result Value Ref Range   Color, Urine YELLOW YELLOW   APPearance HAZY (A) CLEAR   Specific Gravity, Urine 1.027 1.005 - 1.030   pH 5.0 5.0 - 8.0   Glucose, UA NEGATIVE NEGATIVE mg/dL   Hgb urine dipstick NEGATIVE NEGATIVE   Bilirubin Urine NEGATIVE NEGATIVE   Ketones, ur NEGATIVE NEGATIVE mg/dL   Protein, ur NEGATIVE NEGATIVE mg/dL   Nitrite NEGATIVE NEGATIVE   Leukocytes,Ua SMALL (A) NEGATIVE    Comment: Performed at [facility], [address] [place], [place] [address]  Basic metabolic panel     Status: Abnormal   Collection Time: 08/05/19  5:01 PM  Result Value Ref Range   Sodium 136 135 - 145 mmol/L   Potassium 3.7 3.5 - 5.1 mmol/L   Chloride 103 98 - 111 mmol/L   CO2 26 22 - 32 mmol/L   Glucose, Bld 107 (H) 70 - 99 mg/dL    Comment: Glucose reference range applies only to samples taken after fasting for at least 8 hours.   BUN 12 6 - 20 mg/dL   Creatinine, Ser 8.91 0.44 - 1.00 mg/dL   Calcium 8.6 (L) 8.9 - 10.3 mg/dL   GFR calc non Af Amer >60 >60 mL/min   GFR calc Af Amer >60 >60 mL/min   Anion gap 7 5 - 15    Comment: Performed at [facility], [address], [place], [place] [address]  CBC with Differential     Status: Abnormal   Collection Time: 08/05/19  5:01 PM  Result Value  Ref Range   WBC 11.0 (H) 4.0 - 10.5 K/uL   RBC 3.86 (L) 3.87 - 5.11 MIL/uL   Hemoglobin 11.5 (L) 12.0 - 15.0 g/dL   HCT 38.8 36 - 46 %   MCV 93.5 80.0 - 100.0 fL   MCH 29.8 26.0 - 34.0 pg   MCHC 31.9 30.0 - 36.0 g/dL   RDW 82.8 00.3 - 49.1 %  Platelets 286 150 - 400 K/uL   nRBC 0.0 0.0 - 0.2 %   Neutrophils Relative % 92 %   Neutro Abs 10.0 (H) 1.7 - 7.7 K/uL   Lymphocytes Relative 7 %   Lymphs Abs 0.8 0.7 - 4.0 K/uL   Monocytes Relative 1 %   Monocytes Absolute 0.2 0 - 1 K/uL   Eosinophils Relative 0 %   Eosinophils Absolute 0.0 0 - 0 K/uL   Basophils Relative 0 %   Basophils Absolute 0.0 0 - 0 K/uL   Immature Granulocytes 0 %   Abs Immature Granulocytes 0.04 0.00 - 0.07 K/uL    Comment: Performed at [facility], [address], [place], [place] [address]    MR LUMBAR SPINE WO CONTRAST  Result Date: 08/05/2019 CLINICAL DATA:  Low back pain with left lower extremity numbness EXAM: MRI LUMBAR SPINE WITHOUT CONTRAST TECHNIQUE: Multiplanar, multisequence MR imaging of the lumbar spine was performed. No intravenous contrast was administered. COMPARISON:  X-ray 11/06/2013 FINDINGS: Segmentation: The lowest well developed disc space is designated as L5-S1. Rudimentary ribs were noted on prior lumbar spine radiograph at the T12 level. Alignment:  Physiologic. Vertebrae:  No fracture, evidence of discitis, or bone lesion. Conus medullaris and cauda equina: Conus extends to the L1 level. Conus and cauda equina appear normal. Paraspinal and other soft tissues: Negative. Disc levels: T12-L1: Unremarkable. L1-L2: Unremarkable. L2-L3: Unremarkable. L3-L4: Mild diffuse disc bulge with posterior annular fissure. Mild bilateral facet hypertrophy. Mild bilateral subarticular recess narrowing without canal stenosis. No foraminal stenosis. L4-L5: Large posterior disc protrusion, slightly eccentric to the left resulting in severe canal stenosis and severe bilateral subarticular recess  stenosis. Bilateral foramina are patent. L5-S1: Moderate posterior disc protrusion resulting in moderate left and mild right subarticular recess stenosis with mild canal stenosis. Bilateral foramina are patent. IMPRESSION: 1. 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 2. Moderate posterior disc protrusion at L5-S1 resulting in moderate left and mild right subarticular recess stenosis with mild canal stenosis. 3. Mild bilateral subarticular recess narrowing at L3-L4. Electronically Signed   By: Duanne Guess D.O.   On: 08/05/2019 11:56    Impression/Plan   39 y.o. female  with relatively chronic back pain and now left greater than right leg pain x2 months.  MRI of the lumbar spine was reviewed revealing a large disc protrusion at L4-5 resulting in severe canal stenosis and severe bilateral recess stenosis.  There is also a moderate disc protrusion at L5-S1 with  Mild canal stenosis & left greater than right recess stenosis.   On exam she has L>R  Dorsiflexion weakness without true drop foot.  She also is starting to have issues with incomplete void and difficulties starting urination.  I had a long discussion with the patient regarding her MRI findings as it relates to her symptoms.   I suspect her symptoms are stemming from the large disc herniation at L4-5 that results in severe canal stenosis.  Given her dorsiflexion weakness as well as difficulties with urination, I have recommended patient be admitted and undergo L4-5 laminotomy microdiskectomy for decompression.  We discussed the surgery in detail including risks, benefits and alternatives.  We discussed the expected postoperative course.  The patient stated her understanding and wished to proceed.  I have called the OR and scheduled the patient for later this evening.  We will plan to admit for observation after surgery with likely discharge tomorrow.  Cindra Presume, PA-C [facility]

## 2019-08-05 NOTE — ED Notes (Signed)
[facility] called for transport to [facility]

## 2019-08-05 NOTE — Anesthesia Procedure Notes (Signed)
Procedure Name: Intubation Date/Time: 08/05/2019 9:15 PM Performed by: Zollie Scale, CRNA Pre-anesthesia Checklist: Patient identified, Emergency Drugs available, Suction available and Patient being monitored Patient Re-evaluated:Patient Re-evaluated prior to induction Oxygen Delivery Method: Circle System Utilized Preoxygenation: Pre-oxygenation with 100% oxygen Induction Type: IV induction Ventilation: Mask ventilation without difficulty Laryngoscope Size: Miller and 2 Grade View: Grade I Tube type: Oral Tube size: 7.0 mm Number of attempts: 1 Airway Equipment and Method: Stylet Placement Confirmation: ETT inserted through vocal cords under direct vision,  positive ETCO2 and breath sounds checked- equal and bilateral Secured at: 21 cm Tube secured with: Tape Dental Injury: Teeth and Oropharynx as per pre-operative assessment

## 2019-08-05 NOTE — ED Provider Notes (Signed)
[facility]-EMERGENCY DEPT Provider Note   CSN: [id number] Arrival date & time: 08/05/19  0932     History Chief Complaint  Patient presents with  . Back Pain    Jerrye BeaversJulie T Nomura is a 39 y.o. female.  Presents ER with concern for back pain.  Patient reports that she has long history of back pain however this significantly worsened over the past 2 months.  States she has had worsening left leg tingling sensation, worse on outer leg, lateral foot.  No associated weakness.  Has not had any bladder or bowel incontinence.  Thinks she may be urinating more frequently but does not feel that she is having incomplete emptying.  [facility].  Outpatient MRI ordered but patient was unable to complete due to insurance denying preauthorization.  Had been doing some physical therapy.  Over the past 24 hours feels that she has been having acute worsening of her pain, now 10 out of 10 sharp, stabbing, radiates down left leg.  Had been on steroid courses that seem to help.  Last steroid course finished a couple days ago.   HPI     History reviewed. No pertinent past medical history.  Patient Active Problem List   Diagnosis Date Noted  . Intractable pain 08/05/2019  . Lumbar herniated disc 08/05/2019  . Lumbar spinal stenosis 08/05/2019      OB History   No obstetric history on file.     History reviewed. No pertinent family history.  Social History   Tobacco Use  . Smoking status: Never Smoker  Substance Use Topics  . Alcohol use: No  . Drug use: No    Home Medications Prior to Admission medications   Medication Sig Start Date End Date Taking? Authorizing Provider  acetaminophen (TYLENOL) 500 MG tablet Take 1,000 mg by mouth every 6 (six) hours as needed for mild pain or headache.   Yes [provider]  cyclobenzaprine (FLEXERIL) 10 MG tablet Take 10 mg by mouth 3 (three) times daily as needed for muscle spasms.   Yes  [provider]  ibuprofen (ADVIL) 800 MG tablet Take 800 mg by mouth every 8 (eight) hours as needed for headache or mild pain.   Yes [provider]    Allergies    Patient has no known allergies.  Review of Systems   Review of Systems  Constitutional: Negative for chills and fever.  HENT: Negative for ear pain and sore throat.   Eyes: Negative for pain and visual disturbance.  Respiratory: Negative for cough and shortness of breath.   Cardiovascular: Negative for chest pain and palpitations.  Gastrointestinal: Negative for abdominal pain and vomiting.  Genitourinary: Negative for dysuria and hematuria.  Musculoskeletal: Positive for back pain. Negative for arthralgias.  Skin: Negative for color change and rash.  Neurological: Negative for seizures, syncope and weakness.  All other systems reviewed and are negative.   Physical Exam Updated Vital Signs BP 124/77 (BP Location: Left Arm)   Pulse 87   Temp 98.9 F (37.2 C) (Oral)   Resp 20   Ht 5\' 7"  (1.702 m)   Wt (!) 124.3 kg   SpO2 98%   BMI 42.91 kg/m   Physical Exam Vitals and nursing note reviewed.  Constitutional:      General: She is not in acute distress.    Appearance: She is well-developed.  HENT:     Head: Normocephalic and atraumatic.  Eyes:     Conjunctiva/sclera:  Conjunctivae normal.  Cardiovascular:     Rate and Rhythm: Normal rate and regular rhythm.     Heart sounds: No murmur heard.   Pulmonary:     Effort: Pulmonary effort is normal. No respiratory distress.     Breath sounds: Normal breath sounds.  Abdominal:     Palpations: Abdomen is soft.     Tenderness: There is no abdominal tenderness.  Musculoskeletal:        General: No deformity or signs of injury.     Cervical back: Neck supple.  Skin:    General: Skin is warm and dry.     Capillary Refill: Capillary refill takes less than 2 seconds.  Neurological:     Mental Status: She is alert.     Comments: Sensation to  light touch intact in b/l lower extremities, states feels a little different over lateral left leg; b/l 5/5 strength on hip flexion/extension, knee flexion/extension, plantar/dorsi flexion and EHL     ED Results / Procedures / Treatments   Labs (all labs ordered are listed, but only abnormal results are displayed) Labs Reviewed  URINALYSIS, ROUTINE W REFLEX MICROSCOPIC - Abnormal; Notable for the following components:      Result Value   APPearance HAZY (*)    Leukocytes,Ua SMALL (*)    All other components within normal limits  BASIC METABOLIC PANEL - Abnormal; Notable for the following components:   Glucose, Bld 107 (*)    Calcium 8.6 (*)    All other components within normal limits  CBC WITH DIFFERENTIAL/PLATELET - Abnormal; Notable for the following components:   WBC 11.0 (*)    RBC 3.86 (*)    Hemoglobin 11.5 (*)    Neutro Abs 10.0 (*)    All other components within normal limits  CBC - Abnormal; Notable for the following components:   WBC 14.4 (*)    RBC 3.75 (*)    Hemoglobin 11.1 (*)    HCT 34.3 (*)    All other components within normal limits  BASIC METABOLIC PANEL - Abnormal; Notable for the following components:   Glucose, Bld 152 (*)    All other components within normal limits  SARS CORONAVIRUS 2 BY RT PCR (HOSPITAL ORDER, PERFORMED IN [facility])  PROTIME-INR  APTT  POC URINE PREG, ED    EKG None  Radiology MR LUMBAR SPINE WO CONTRAST  Result Date: 08/05/2019 CLINICAL DATA:  Low back pain with left lower extremity numbness EXAM: MRI LUMBAR SPINE WITHOUT CONTRAST TECHNIQUE: Multiplanar, multisequence MR imaging of the lumbar spine was performed. No intravenous contrast was administered. COMPARISON:  X-ray 11/06/2013 FINDINGS: Segmentation: The lowest well developed disc space is designated as L5-S1. Rudimentary ribs were noted on prior lumbar spine radiograph at the T12 level. Alignment:  Physiologic. Vertebrae:  No fracture, evidence of  discitis, or bone lesion. Conus medullaris and cauda equina: Conus extends to the L1 level. Conus and cauda equina appear normal. Paraspinal and other soft tissues: Negative. Disc levels: T12-L1: Unremarkable. L1-L2: Unremarkable. L2-L3: Unremarkable. L3-L4: Mild diffuse disc bulge with posterior annular fissure. Mild bilateral facet hypertrophy. Mild bilateral subarticular recess narrowing without canal stenosis. No foraminal stenosis. L4-L5: Large posterior disc protrusion, slightly eccentric to the left resulting in severe canal stenosis and severe bilateral subarticular recess stenosis. Bilateral foramina are patent. L5-S1: Moderate posterior disc protrusion resulting in moderate left and mild right subarticular recess stenosis with mild canal stenosis. Bilateral foramina are patent. IMPRESSION: 1. Large posterior disc protrusion at L4-L5  resulting in severe canal stenosis and severe bilateral subarticular recess stenosis. 2. Moderate posterior disc protrusion at L5-S1 resulting in moderate left and mild right subarticular recess stenosis with mild canal stenosis. 3. Mild bilateral subarticular recess narrowing at L3-L4. Electronically Signed   By: Duanne Guess D.O.   On: 08/05/2019 11:56   DG Lumbar Spine 1 View  Result Date: 08/05/2019 CLINICAL DATA:  Surgery. EXAM: LUMBAR SPINE - 1 VIEW COMPARISON:  Lumbar MRI earlier today. FINDINGS: Single portable cross-table lateral view of the lumbar spine obtained in the operating room. Surgical instruments localize posterior to L4-L5 disc space. IMPRESSION: Surgical instruments localize posterior to the L4-L5 disc space. Electronically Signed   By: Narda Rutherford M.D.   On: 08/05/2019 23:44    Procedures Procedures (including critical care time)  Medications Ordered in ED Medications  fentaNYL (SUBLIMAZE) injection 50 mcg ( Intravenous MAR Unhold 08/05/19 2344)  0.9 %  sodium chloride infusion ( Intravenous Transfusing/Transfer 08/05/19 2350)  sodium  chloride flush (NS) 0.9 % injection 3 mL (3 mLs Intravenous Not Given 08/05/19 2351)  sodium chloride flush (NS) 0.9 % injection 3 mL (has no administration in time range)  0.9 %  sodium chloride infusion (250 mLs Intravenous Transfusing/Transfer 08/05/19 2350)  ceFAZolin (ANCEF) IVPB 2g/100 mL premix (2 g Intravenous New Bag/Given 08/06/19 0444)  acetaminophen (TYLENOL) tablet 1,000 mg (1,000 mg Oral Given 08/06/19 0553)  gabapentin (NEURONTIN) capsule 300 mg (300 mg Oral Given 08/06/19 0004)  acetaminophen (TYLENOL) tablet 650 mg (has no administration in time range)    Or  acetaminophen (TYLENOL) suppository 650 mg (has no administration in time range)  HYDROcodone-acetaminophen (NORCO/VICODIN) 5-325 MG per tablet 1 tablet (has no administration in time range)  oxyCODONE (Oxy IR/ROXICODONE) immediate release tablet 5-10 mg (5 mg Oral Given 08/06/19 0442)  HYDROmorphone (DILAUDID) injection 0.5-1 mg (has no administration in time range)  methocarbamol (ROBAXIN) tablet 500 mg (500 mg Oral Given 08/06/19 0004)    Or  methocarbamol (ROBAXIN) 500 mg in dextrose 5 % 50 mL IVPB ( Intravenous See Alternative 08/06/19 0004)  docusate sodium (COLACE) capsule 100 mg (100 mg Oral Given 08/06/19 0005)  senna (SENOKOT) tablet 8.6 mg (8.6 mg Oral Given 08/06/19 0005)  senna-docusate (Senokot-S) tablet 1 tablet (has no administration in time range)  bisacodyl (DULCOLAX) suppository 10 mg (has no administration in time range)  sodium phosphate (FLEET) 7-19 GM/118ML enema 1 enema (has no administration in time range)  ondansetron (ZOFRAN) tablet 4 mg (has no administration in time range)    Or  ondansetron (ZOFRAN) injection 4 mg (has no administration in time range)  menthol-cetylpyridinium (CEPACOL) lozenge 3 mg (has no administration in time range)    Or  phenol (CHLORASEPTIC) mouth spray 1 spray (has no administration in time range)  HYDROmorphone (DILAUDID) injection 1 mg (1 mg Intravenous Given 08/05/19 1048)    diazepam (VALIUM) tablet 2 mg (2 mg Oral Given 08/05/19 1045)  LORazepam (ATIVAN) injection 1 mg (1 mg Intravenous Given 08/05/19 1120)  morphine 4 MG/ML injection 4 mg (4 mg Intravenous Given 08/05/19 1251)  methylPREDNISolone sodium succinate (SOLU-MEDROL) 125 mg/2 mL injection 125 mg (125 mg Intravenous Given by Other 08/05/19 1631)  fentaNYL (SUBLIMAZE) injection 100 mcg (100 mcg Intravenous Given by Other 08/05/19 1632)  methocarbamol (ROBAXIN) tablet 500 mg (500 mg Oral Given by Other 08/05/19 1622)  oxyCODONE-acetaminophen (PERCOCET/ROXICET) 5-325 MG per tablet 1 tablet (1 tablet Oral Given 08/05/19 1640)  gabapentin (NEURONTIN) capsule 300 mg (300 mg Oral Given  08/05/19 1916)    ED Course  I have reviewed the triage vital signs and the nursing notes.  Pertinent labs & imaging results that were available during my care of the patient were reviewed by me and considered in my medical decision making (see chart for details).  Clinical Course as of Aug 05 724  Tue Aug 05, 2019  1214 Reviewed case with [place], recommends close outpatient follow-up with Dr. Shon Baton at [facility]   [RD]    Clinical Course User Index [RD] Milagros Loll, MD   MDM Rules/Calculators/A&P                          39 year old lady presented to ER with acute on chronic low back pain.  On exam patient noted to be uncomfortable due to pain, had some tingling on left leg but no focal weakness.  Given severity of pain, tingling, obtain MRI to further evaluate.  Concerning for posterior disc protrusion at L4-L5, severe canal stenosis.  Initially discussed with [facility] because patient had seen physiatry at their clinic for her back pain previously.  Dr. Linna Caprice had initially recommended steroids and outpatient management if symptoms were controlled.  Unfortunately, patient continued to have severe pain despite multiple rounds of IV analgesia, discussed further with Dr. Linna Caprice who recommended consultation with  neurosurgery on-call.  I reviewed case with Dr. Conchita Paris who recommended admission to his service, likely OR later tonight or tomorrow.  Final Clinical Impression(s) / ED Diagnoses Final diagnoses:  Spinal stenosis of lumbar region, unspecified whether neurogenic claudication present    Rx / DC Orders ED Discharge Orders    None       Milagros Loll, MD 08/06/19 [phone]

## 2019-08-05 NOTE — Transfer of Care (Signed)
Immediate Anesthesia Transfer of Care Note  Patient: Jenna Galvan  Procedure(s) Performed: LUMBAR LAMINECTOMY, MICRODISCECTOMY  Lumbar Four - Lumbar Five (N/A Back)  Patient Location: PACU  Anesthesia Type:General  Level of Consciousness: drowsy, patient cooperative and responds to stimulation  Airway & Oxygen Therapy: Patient Spontanous Breathing and Patient connected to face mask oxygen  Post-op Assessment: Report given to RN, Post -op Vital signs reviewed and stable, Patient moving all extremities and Patient moving all extremities X 4  Post vital signs: Reviewed and stable  Last Vitals:  Vitals Value Taken Time  BP 125/75 08/05/19 2246  Temp    Pulse 100 08/05/19 2251  Resp 10 08/05/19 2251  SpO2 100 % 08/05/19 2251  Vitals shown include unvalidated device data.  Last Pain:  Vitals:   08/05/19 2245  TempSrc:   PainSc: (P) 0-No pain         Complications: No complications documented.

## 2019-08-05 NOTE — Op Note (Signed)
  NEUROSURGERY OPERATIVE NOTE   PREOP DIAGNOSIS: Lumbar disc herniation, L4-5  POSTOP DIAGNOSIS: Same  PROCEDURE: 1. Left L4-5 laminotomy and microdiscectomy for decompression of nerve root 2. Use of operating microscope  SURGEON: Dr. Lisbeth Renshaw, MD  ASSISTANT: Cindra Presume, PA-C  ANESTHESIA: General Endotracheal  EBL: 50cc  SPECIMENS: None  DRAINS: None  COMPLICATIONS: None immediate  CONDITION: Hemodynamically stable to PACU  HISTORY: Jenna Galvan is a 39 y.o. female presenting to the emergency department with progressively worsening back and left-sided leg pain over the last 2 months.  She had been treated conservatively on an outpatient basis.  Pain became unbearable and she came to the emergency department where MRI demonstrated large central to left eccentric L4-5 disc herniation with severe central and left greater than right lateral recess stenosis.  Her physical exam findings did demonstrate mild left dorsiflexion weakness and she was complaining of subjective partial urinary retention.  With her clinical and radiographic findings, surgical decompression was recommended.  The risks, benefits, and alternatives to surgery were all reviewed in detail with the patient.  After all her questions were answered informed consent was obtained and witnessed.  PROCEDURE IN DETAIL: After informed consent was obtained and witnessed, the patient was brought to the operating room. After induction of general anesthesia, the patient was positioned on the operative table in the prone position with all pressure points meticulously padded. The skin of the low back was then prepped and draped in the usual sterile fashion.  After timeout was conducted, spinal needle was introduced to identify the surface projection of the L4-5 disc space.  Incision was then infiltrated with local anesthetic with epinephrine.  Skin incision was then made sharply and Bovie electrocautery was used to  dissect the subcutaneous tissue until the lumbodorsal fascia was identified. The fascia was then incised using Bovie electrocautery and the lamina at the L4 and L5 levels was identified and dissection was carried out in the subperiosteal plane. Self-retaining retractor was then placed, and intraoperative x-ray was taken with a dissector in the L4-5 interspace to confirm we were at the correct level.  Using a high-speed drill, laminotomy was completed with a partial medial facetectomy at L4-5. The ligamentum flavum was then identified and removed and the left L5 nerve root was identified.  Nerve root appeared to be displaced dorsally.  Dissection was carried out in the ventral epidural space and a relatively large disc herniation was easily identified.  The nerve root was retracted medially and the annulus was incised with 11 blade scalpel.  Using a combination of dissectors and curettes, multiple large subligamentous disc fragments were removed.  Once this was done, I was able to pass a long ball-tipped dissector underneath the L5 nerve root and I was also able to pass the long dissector across to the contralateral side, without any further ventral compression identified.  Hemostasis was then secured using a combination of morcellized Gelfoam and thrombin and bipolar electrocautery. The wound is irrigated with copious amounts of antibiotic saline irrigation. The nerve root was then covered with a Depo-Medrol solution. Self-retaining retractor was then removed, and the wound is closed in layers using a combination of interrupted 0 Vicryl and 3-0 Vicryl stitches. The skin was closed using standard skin glue.  At the end of the case all sponge, needle, cottonoid, and instrument counts were correct. The patient was then transferred to the stretcher and taken to the postanesthesia care unit in stable hemodynamic condition.

## 2019-08-05 NOTE — Anesthesia Preprocedure Evaluation (Signed)
Anesthesia Evaluation  Patient identified by MRN, date of birth, ID band Patient awake    Reviewed: Allergy & Precautions, NPO status , Patient's Chart, lab work & pertinent test results  Airway Mallampati: II  TM Distance: >3 FB Neck ROM: Full    Dental  (+) Dental Advisory Given   Pulmonary neg pulmonary ROS,    breath sounds clear to auscultation       Cardiovascular negative cardio ROS   Rhythm:Regular Rate:Normal     Neuro/Psych Herniated disc    GI/Hepatic negative GI ROS, Neg liver ROS,   Endo/Other  Morbid obesity  Renal/GU negative Renal ROS     Musculoskeletal   Abdominal   Peds  Hematology  (+) anemia ,   Anesthesia Other Findings   Reproductive/Obstetrics                             Lab Results  Component Value Date   WBC 11.0 (H) 08/05/2019   HGB 11.5 (L) 08/05/2019   HCT 36.1 08/05/2019   MCV 93.5 08/05/2019   PLT 286 08/05/2019   Lab Results  Component Value Date   CREATININE 0.55 08/05/2019   BUN 12 08/05/2019   NA 136 08/05/2019   K 3.7 08/05/2019   CL 103 08/05/2019   CO2 26 08/05/2019    Anesthesia Physical Anesthesia Plan  ASA: III and emergent  Anesthesia Plan: General   Post-op Pain Management:    Induction: Intravenous and Rapid sequence  PONV Risk Score and Plan: 3 and Dexamethasone, Ondansetron and Treatment may vary due to age or medical condition  Airway Management Planned: Oral ETT  Additional Equipment: None  Intra-op Plan:   Post-operative Plan: Extubation in OR  Informed Consent: I have reviewed the patients History and Physical, chart, labs and discussed the procedure including the risks, benefits and alternatives for the proposed anesthesia with the patient or authorized representative who has indicated his/her understanding and acceptance.     Dental advisory given  Plan Discussed with: CRNA  Anesthesia Plan Comments:          Anesthesia Quick Evaluation

## 2019-08-05 NOTE — ED Triage Notes (Signed)
Patient reports to the ER after blowing out her disk. Patient reports she cannot move her legs well and is having numbness and tingling. Patient reports she was supposed to get an MRI following therapy.

## 2019-08-06 ENCOUNTER — Encounter (HOSPITAL_COMMUNITY): Payer: Self-pay | Admitting: Neurosurgery

## 2019-08-06 LAB — BASIC METABOLIC PANEL
Anion gap: 9 (ref 5–15)
BUN: 7 mg/dL (ref 6–20)
CO2: 22 mmol/L (ref 22–32)
Calcium: 9 mg/dL (ref 8.9–10.3)
Chloride: 106 mmol/L (ref 98–111)
Creatinine, Ser: 0.65 mg/dL (ref 0.44–1.00)
GFR calc Af Amer: >60 mL/min (ref >60)
GFR calc non Af Amer: >60 mL/min (ref >60)
Glucose, Bld: 152 mg/dL — ABNORMAL HIGH (ref 70–99)
Potassium: 4.4 mmol/L (ref 3.5–5.1)
Sodium: 137 mmol/L (ref 135–145)

## 2019-08-06 LAB — CBC
HCT: 34.3 % — ABNORMAL LOW (ref 36.0–46.0)
Hemoglobin: 11.1 g/dL — ABNORMAL LOW (ref 12.0–15.0)
MCH: 29.6 pg (ref 26.0–34.0)
MCHC: 32.4 g/dL (ref 30.0–36.0)
MCV: 91.5 fL (ref 80.0–100.0)
Platelets: 257 10*3/uL (ref 150–400)
RBC: 3.75 MIL/uL — ABNORMAL LOW (ref 3.87–5.11)
RDW: 14.6 % (ref 11.5–15.5)
WBC: 14.4 10*3/uL — ABNORMAL HIGH (ref 4.0–10.5)
nRBC: 0 % (ref 0.0–0.2)

## 2019-08-06 LAB — PROTIME-INR
INR: 1.1 (ref 0.8–1.2)
Prothrombin Time: 13.9 seconds (ref 11.4–15.2)

## 2019-08-06 LAB — APTT: aPTT: 29 seconds (ref 24–36)

## 2019-08-06 MED ORDER — METHOCARBAMOL 750 MG PO TABS: 750.0000 mg | ORAL_TABLET | Freq: Three times a day (TID) | ORAL | 1 refills | Status: AC | PRN

## 2019-08-06 MED ORDER — HYDROCODONE-ACETAMINOPHEN 5-325 MG PO TABS: 1.0000 | ORAL_TABLET | ORAL | 0 refills | Status: AC | PRN

## 2019-08-06 NOTE — Plan of Care (Signed)
Pt doing well. Pt given D/C instructions with verbal understanding. Rx's were sent to the pharmacy by MD. Pt's incision is clean and dry with no sign of infection. Pt's IV was removed prior to D/C. Pt D/C'd home via wheelchair per MD order. Pt is stable @ D/C and has no other needs at this time. Margrett Kalb, RN  

## 2019-08-06 NOTE — Progress Notes (Signed)
  NEUROSURGERY PROGRESS NOTE   No issues overnight.  Complains of appropriate back soreness Left leg pain resolved Voiding normal. Tolerating po. Ambulating well Eager for d/c  EXAM:  BP 106/69 (BP Location: Left Arm)   Pulse 85   Temp 98.6 F (37 C) (Oral)   Resp 18   Ht 5\' 7"  (1.702 m)   Wt (!) 124.3 kg   SpO2 97%   BMI 42.91 kg/m   Awake, alert, oriented  Speech fluent, appropriate  CN grossly intact  5/5 BUE/BLE, except mild L>R  DF weakness  IMPRESSION/PLAN 39 y.o. female  POD1 L4-5 microdisc doing well - d/c home

## 2019-08-06 NOTE — Evaluation (Signed)
Occupational Therapy Evaluation and Discharge Patient Details Name: Jenna Galvan MRN: [id number] DOB: 03-14-1980 Today's Date: 08/06/2019    History of Present Illness Pt is a 39 year old woman admitted with intractible back pain on 08/05/19. She had been participating in OPPT prior to admission. Pt underwent L 4-5 lumbar laminectomy and microdiscectomy on the day of admission. No significant PMH.   Clinical Impression   All education completed and reinforced with written handout. Pt verbalized understanding. No further OT needs.     Follow Up Recommendations  No OT follow up    Equipment Recommendations  None recommended by OT    Recommendations for Other Services       Precautions / Restrictions Precautions Precautions: Back Precaution Booklet Issued: Yes (comment) Precaution Comments: reviewed back precautions related to ADL and IADL      Mobility Bed Mobility               General bed mobility comments: pt in chair  Transfers Overall transfer level: Modified independent Equipment used: None                  Balance                                           ADL either performed or assessed with clinical judgement   ADL Overall ADL's : Needs assistance/impaired Eating/Feeding: Independent   Grooming: Modified independent;Standing Grooming Details (indicate cue type and reason): educated in two cup method for tooth brushing and use of wash cloth on face Upper Body Bathing: Minimal assistance Upper Body Bathing Details (indicate cue type and reason): recommended long handled bath sponge Lower Body Bathing: Minimal assistance;Sit to/from stand Lower Body Bathing Details (indicate cue type and reason): recommended long handled bath sponge Upper Body Dressing : Set up;Sitting   Lower Body Dressing: Minimal assistance Lower Body Dressing Details (indicate cue type and reason): instructed in compensatory strategies for LB dressing,  pt does not plan to wear socks Toilet Transfer: Modified Independent   Toileting- Clothing Manipulation and Hygiene: Modified independent Toileting - Clothing Manipulation Details (indicate cue type and reason): instructed pt to avoid twisting with pericare, consider tongs     Functional mobility during ADLs: Modified independent General ADL Comments: Educated in IADL to avoid.     Vision Patient Visual Report: No change from baseline       Perception     Praxis      Pertinent Vitals/Pain Pain Assessment: Faces Faces Pain Scale: Hurts a little bit Pain Location: incision Pain Descriptors / Indicators: Sore Pain Intervention(s): Monitored during session;Repositioned     Hand Dominance Right   Extremity/Trunk Assessment Upper Extremity Assessment Upper Extremity Assessment: Overall WFL for tasks assessed   Lower Extremity Assessment Lower Extremity Assessment: Defer to PT evaluation       Communication Communication Communication: No difficulties   Cognition Arousal/Alertness: Awake/alert Behavior During Therapy: WFL for tasks assessed/performed Overall Cognitive Status: Within Functional Limits for tasks assessed                                     General Comments       Exercises     Shoulder Instructions      Home Living Family/patient expects to be discharged to:: Private residence Living Arrangements:  Spouse/significant other;Children (73 year old daughter) Available Help at Discharge: Family;Available 24 hours/day Type of Home: House Home Access: Stairs to enter [employer] of Steps: 4   Home Layout: One level     Bathroom Shower/Tub: Chief Strategy Officer: Standard     Home Equipment: Hand held shower head          Prior Functioning/Environment Level of Independence: Needs assistance    ADL's / Homemaking Assistance Needed: family has been helping her wash her back and with donning her shoes and  socks as needed as well as houskeeping activities            OT Problem List:        OT Treatment/Interventions:      OT Goals(Current goals can be found in the care plan section) Acute Rehab OT Goals Patient Stated Goal: return to independence  OT Frequency:     Barriers to D/C:            Co-evaluation              AM-PAC OT "6 Clicks" Daily Activity     Outcome Measure Help from another person eating meals?: None Help from another person taking care of personal grooming?: None Help from another person toileting, which includes using toliet, bedpan, or urinal?: None Help from another person bathing (including washing, rinsing, drying)?: A Little Help from another person to put on and taking off regular upper body clothing?: None Help from another person to put on and taking off regular lower body clothing?: A Little 6 Click Score: 22   End of Session    Activity Tolerance: Patient tolerated treatment well Patient left: in chair;with call bell/phone within reach  OT Visit Diagnosis: Pain                Time: 8315-1761 OT Time Calculation (min): 14 min Charges:  OT General Charges $OT Visit: 1 Visit OT Evaluation $OT Eval Low Complexity: 1 Low  Martie Round, OTR/L Acute Rehabilitation Services Pager: [phone] Office: [phone] Jenna Galvan, Jenna Galvan 08/06/2019, 8:43 AM

## 2019-08-06 NOTE — Evaluation (Signed)
Physical Therapy Evaluation Patient Details Name: Jenna Galvan MRN: [id number] DOB: Jul 14, 1980 Today's Date: 08/06/2019   History of Present Illness  Pt is a 39 year old woman admitted with intractible back pain on 08/05/19. She had been participating in OPPT prior to admission. Pt underwent L 4-5 lumbar laminectomy and microdiscectomy on the day of admission. No significant PMH.    Clinical Impression  Pt presented supine in bed with HOB elevated, awake and willing to participate in therapy session. Prior to admission, pt reported that she was independent with ambulation and required assistance from her family for some ADLs, specifically LB dressing/bathing. Pt lives with her husband and daughter in a single level home with four steps to enter. At the time of evaluation, pt overall moving very well with supervision for safety with all functional mobility including hallway ambulation without needing an AD. Pt participated in stair training this session as well without difficulties. PT provided pt education re: back precautions, car transfers and a generalized walking program for pt to initiate upon d/c home. No further acute PT needs identified at this time. PT signing off.     Follow Up Recommendations No PT follow up    Equipment Recommendations  None recommended by PT    Recommendations for Other Services       Precautions / Restrictions Precautions Precautions: Back Precaution Booklet Issued: Yes (comment) Precaution Comments: reviewed all back precautions with pt throughout in relation to mobility Restrictions Weight Bearing Restrictions: No      Mobility  Bed Mobility Overal bed mobility: Needs Assistance Bed Mobility: Rolling;Sidelying to Sit Rolling: Supervision Sidelying to sit: Supervision       General bed mobility comments: cueing for log roll technique  Transfers Overall transfer level: Modified independent Equipment used: None                 Ambulation/Gait Ambulation/Gait assistance: Supervision Gait Distance (Feet): 500 Feet Assistive device: None Gait Pattern/deviations: Step-through pattern;Decreased stride length Gait velocity: reduced   General Gait Details: pt steady overall with gait in hallway without the need for an AD or physical assistance, no LOB  Stairs Stairs: Yes Stairs assistance: Supervision Stair Management: One rail Left;Step to pattern;Forwards Number of Stairs: 10 General stair comments: no difficulties or instability  Wheelchair Mobility    Modified Rankin (Stroke Patients Only)       Balance Overall balance assessment: No apparent balance deficits (not formally assessed)                                           Pertinent Vitals/Pain Pain Assessment: Faces Faces Pain Scale: Hurts a little bit Pain Location: incision Pain Descriptors / Indicators: Sore Pain Intervention(s): Monitored during session;Repositioned    Home Living Family/patient expects to be discharged to:: Private residence Living Arrangements: Spouse/significant other;Children (38 year old daughter) Available Help at Discharge: Family;Available 24 hours/day Type of Home: House Home Access: Stairs to enter   [employer] of Steps: 4 Home Layout: One level Home Equipment: Hand held shower head      Prior Function Level of Independence: Needs assistance   Gait / Transfers Assistance Needed: independent  ADL's / Homemaking Assistance Needed: family has been helping her wash her back and with donning her shoes and socks as needed as well as houskeeping activities        Hand Dominance   Dominant  Hand: Right    Extremity/Trunk Assessment   Upper Extremity Assessment Upper Extremity Assessment: Defer to OT evaluation;Overall WFL for tasks assessed    Lower Extremity Assessment Lower Extremity Assessment: Overall WFL for tasks assessed    Cervical / Trunk  Assessment Cervical / Trunk Assessment: Other exceptions Cervical / Trunk Exceptions: s/p lumbar sx  Communication   Communication: No difficulties  Cognition Arousal/Alertness: Awake/alert Behavior During Therapy: WFL for tasks assessed/performed Overall Cognitive Status: Within Functional Limits for tasks assessed                                        General Comments      Exercises     Assessment/Plan    PT Assessment Patent does not need any further PT services  PT Problem List         PT Treatment Interventions      PT Goals (Current goals can be found in the Care Plan section)  Acute Rehab PT Goals Patient Stated Goal: return to independence PT Goal Formulation: All assessment and education complete, DC therapy    Frequency     Barriers to discharge        Co-evaluation               AM-PAC PT "6 Clicks" Mobility  Outcome Measure Help needed turning from your back to your side while in a flat bed without using bedrails?: None Help needed moving from lying on your back to sitting on the side of a flat bed without using bedrails?: None Help needed moving to and from a bed to a chair (including a wheelchair)?: None Help needed standing up from a chair using your arms (e.g., wheelchair or bedside chair)?: None Help needed to walk in hospital room?: None Help needed climbing 3-5 steps with a railing? : None 6 Click Score: 24    End of Session   Activity Tolerance: Patient tolerated treatment well Patient left: in chair;with call bell/phone within reach Nurse Communication: Mobility status PT Visit Diagnosis: Other abnormalities of gait and mobility (R26.89);Pain Pain - part of body:  (back)    Time: 8563-1497 PT Time Calculation (min) (ACUTE ONLY): 16 min   Charges:   PT Evaluation $PT Eval Low Complexity: 1 Low          Ginette Pitman, PT, DPT  Acute Rehabilitation Services Pager [phone] Office  [phone]    Alessandra Bevels Jahniyah Revere 08/06/2019, 10:01 AM

## 2019-08-06 NOTE — Anesthesia Postprocedure Evaluation (Signed)
Anesthesia Post Note  Patient: Jenna Galvan  Procedure(s) Performed: LUMBAR LAMINECTOMY, MICRODISCECTOMY  Lumbar Four - Lumbar Five (N/A Back)     Patient location during evaluation: PACU Anesthesia Type: General Level of consciousness: awake and alert Pain management: pain level controlled Vital Signs Assessment: post-procedure vital signs reviewed and stable Respiratory status: spontaneous breathing, nonlabored ventilation, respiratory function stable and patient connected to nasal cannula oxygen Cardiovascular status: blood pressure returned to baseline and stable Postop Assessment: no apparent nausea or vomiting Anesthetic complications: no   No complications documented.  Last Vitals:  Vitals:   08/05/19 2315 08/05/19 2338  BP: (!) 146/86 (!) 137/84  Pulse: 99 92  Resp: 16 20  Temp:  37 C  SpO2: 98% 100%    Last Pain:  Vitals:   08/06/19 0006  TempSrc:   PainSc: 3                  Kennieth Rad

## 2019-08-06 NOTE — Discharge Summary (Signed)
Physician Discharge Summary Patient ID: AUSTRALIA DROLL MRN: [id number] DOB/AGE: [birth year] 39 y.o.  Admit date: 08/05/2019 Discharge date: 08/06/2019  Admission Diagnoses:  Lumbar HNP lumbaar spinal stenosis Incomplete cauda equina syndrome  Discharge Diagnoses:  Same Active Problems:   Intractable pain   Lumbar herniated disc   Lumbar spinal stenosis   Discharged Condition: Stable  Hospital Course:  Jenna Galvan is a 39 y.o. female who presented to was along emergency room yesterday for acute worsening of chronic lower back pain.  She also endorses symptoms of incomplete urinary retention.  She underwent an MRI of the lumbar spine which revealed a large herniated disc at L4-5 with resultant severe central canal stenosis.  On exam she was noted to have left greater than right weakness with dorsiflexion without true drop foot.   Given MRI findings, exam and incomplete urinary retention, it was recommended she undergo L4-5 microdiskectomy for decompression.  She underwent said procedure yesterday evening.   Immediately after surgery she noted significant improvement in preoperative symptoms. She had an uncomplicated hospital course. At time of discharge, pain was well controlled, ambulating with Pt/OT, tolerating po, voiding normal. Ready for discharge.   Treatments: Surgery -   Left L4-5 laminotomy microdiscectomy  Discharge Exam: Blood pressure 106/69, pulse 85, temperature 98.6 F (37 C), temperature source Oral, resp. rate 18, height 5\' 7"  (1.702 m), weight (!) 124.3 kg, SpO2 97 %. Awake, alert, oriented Speech fluent, appropriate CN grossly intact 5/5 BUE/BLE, mild L>R DF weakness Wound c/d/i  Disposition: Discharge disposition: 01-Home or Self Care       Discharge Instructions   Call MD for:  difficulty breathing, headache or visual disturbances   Complete by: As directed   Call MD for:  persistant dizziness or light-headedness   Complete by: As  directed   Call MD for:  redness, tenderness, or signs of infection (pain, swelling, redness, odor or green/yellow discharge around incision site)   Complete by: As directed   Call MD for:  severe uncontrolled pain   Complete by: As directed   Call MD for:  temperature >100.4   Complete by: As directed   Diet general   Complete by: As directed   Driving Restrictions   Complete by: As directed   Do not drive until given clearance.  Increase activity slowly   Complete by: As directed   Lifting restrictions   Complete by: As directed   Do not lift anything >10lbs. Avoid bending and twisting in awkward positions. Avoid bending at the back.  May shower / Bathe   Complete by: As directed   In 24 hours. Okay to wash wound with warm soapy water. Avoid scrubbing the wound. Pat dry.  Remove dressing in 24 hours   Complete by: As directed    Allergies as of 08/06/2019  No Known Allergies   Medication List  STOP taking these medications  acetaminophen 500 MG tablet Commonly known as: TYLENOL  cyclobenzaprine 10 MG tablet Commonly known as: FLEXERIL  ibuprofen 800 MG tablet Commonly known as: ADVIL   TAKE these medications  HYDROcodone-acetaminophen 5-325 MG tablet Commonly known as: NORCO/VICODIN Take 1 tablet by mouth every 4 (four) hours as needed for moderate pain.  methocarbamol 750 MG tablet Commonly known as: Robaxin-750 Take 1 tablet (750 mg total) by mouth 3 (three) times daily as needed for muscle spasms.      Follow-up Information    08/08/2019, MD. Schedule an appointment as soon as possible for a  visit in 3 week(s).  Specialty: Neurosurgery Contact information: [address] [place] [address] [phone]           Signed: Alyson Ingles 08/06/2019, 9:45 AM

## 2021-04-12 IMAGING — MR MR LUMBAR SPINE W/O CM
5 series · 31 of 48 positions shown · non-contrast
Comparison: X-ray 11/06/2013

CLINICAL DATA: Low back pain with left lower extremity numbness

EXAM:
MRI LUMBAR SPINE WITHOUT CONTRAST
TECHNIQUE: Multiplanar, multisequence MR imaging of the lumbar spine was
performed. No intravenous contrast was administered.

[Series 5: T1 · sagittal · 4.0mm · 0.81mm/px · 6 of 15 slices shown (1 of 2)]
[im 1/15]
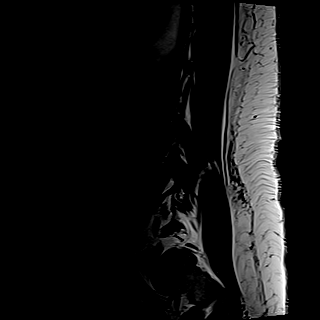
[im 3/15]
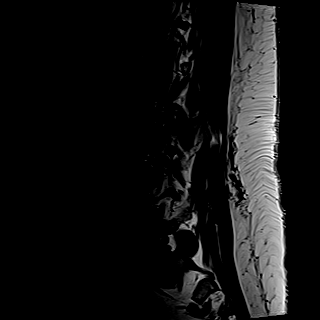
[im 6/15]
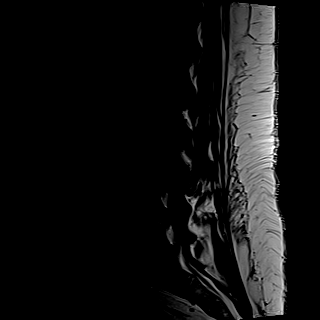
[im 9/15]
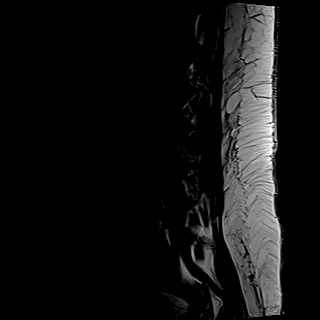
[im 12/15]
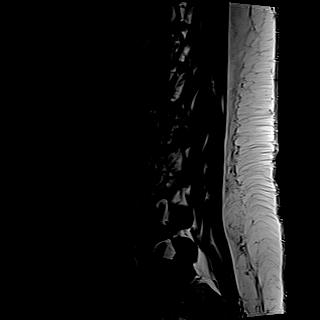
[im 15/15]
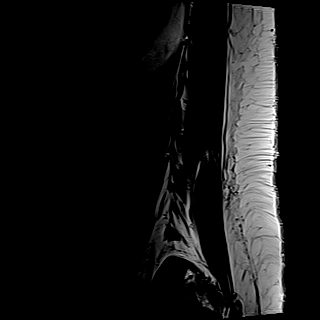

[Series 6: T2 · sagittal · 4.0mm · 0.81mm/px · 6 of 15 slices shown (1 of 2)]
[im 1/15]
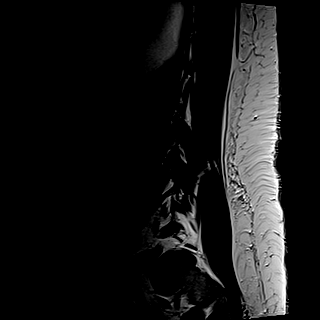
[im 3/15]
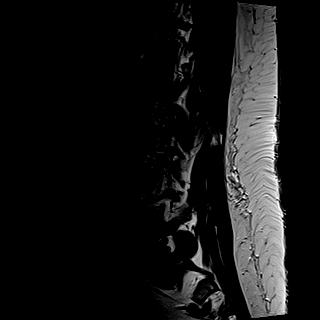
[im 6/15]
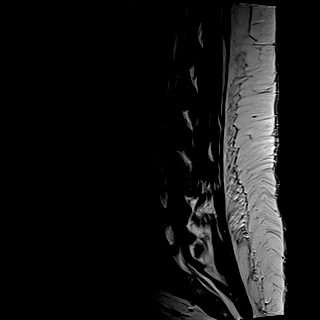
[im 9/15]
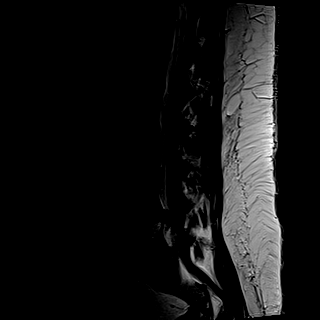
[im 12/15]
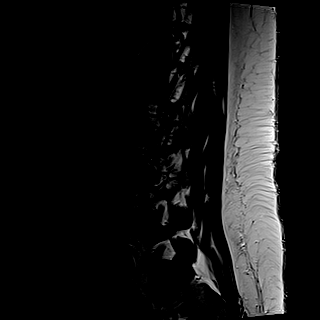
[im 15/15]
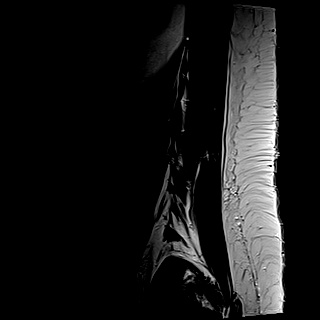

[Series 7: STIR · sagittal · 4.0mm · 0.51mm/px · 1 of 15 slices shown]
[im 1/15]
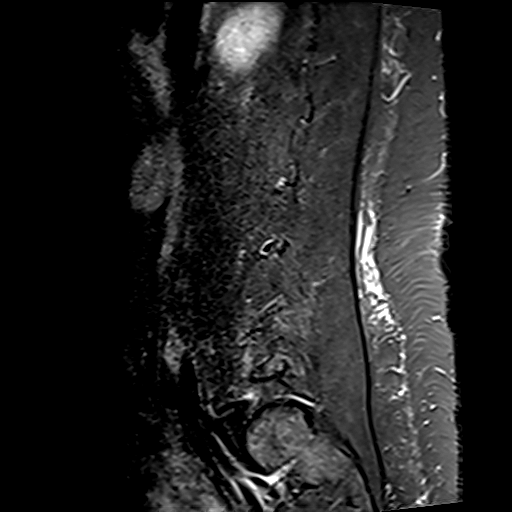

[Series 8: T2 · axial · 4.0mm · 0.62mm/px · z∈[-70,+145]mm · 9 of 40 slices shown (2 of 2)]
[im 1/40]
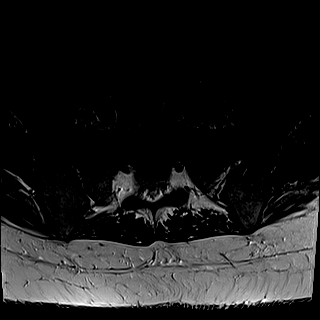
[im 6/40]
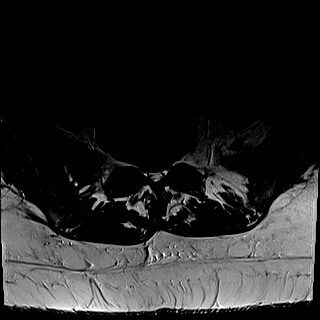
[im 12/40]
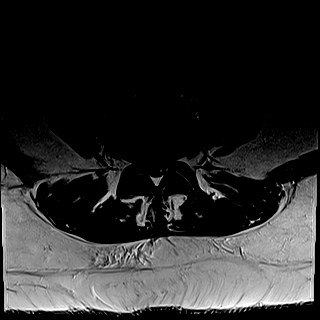
[im 17/40]
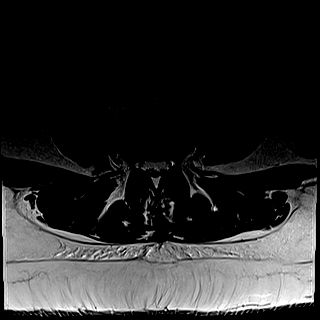
[im 20/40]
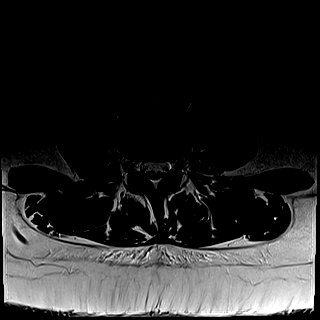
[im 23/40]
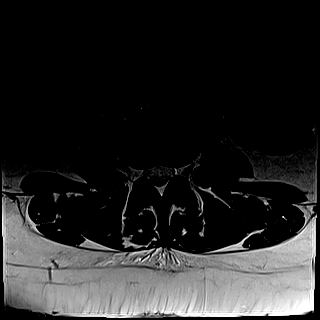
[im 28/40]
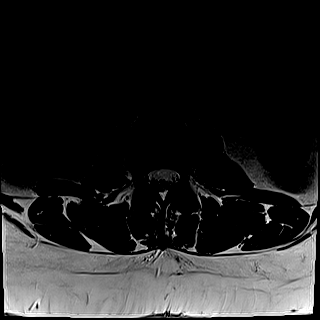
[im 34/40]
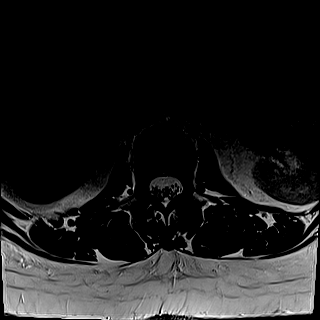
[im 40/40]
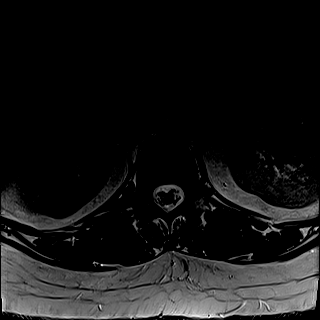

[Series 9: T1 · axial · 4.0mm · 0.39mm/px · z∈[-70,+145]mm · 9 of 40 slices shown (2 of 2)]
[im 1/40]
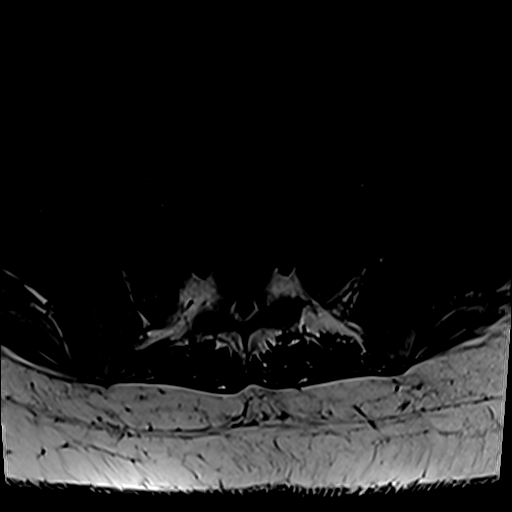
[im 6/40]
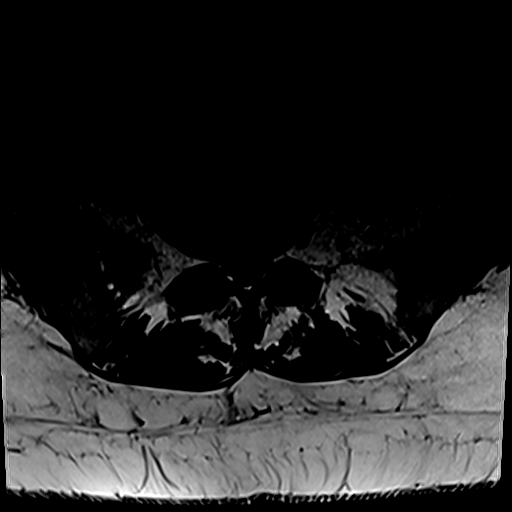
[im 12/40]
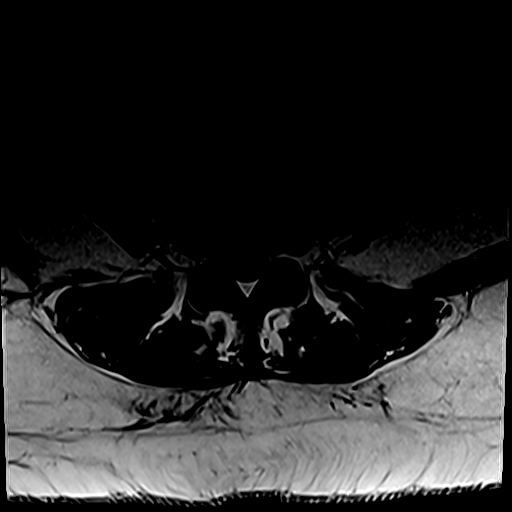
[im 17/40]
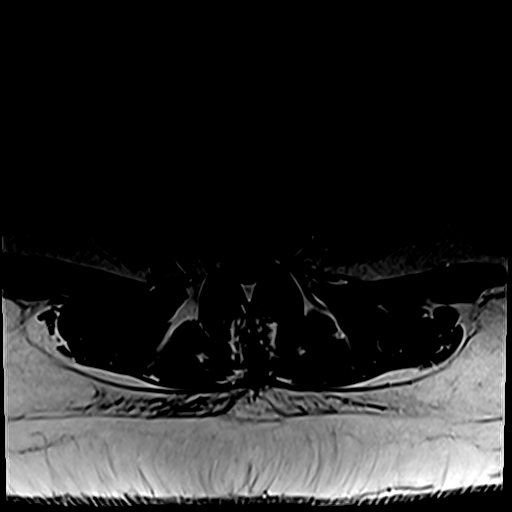
[im 20/40]
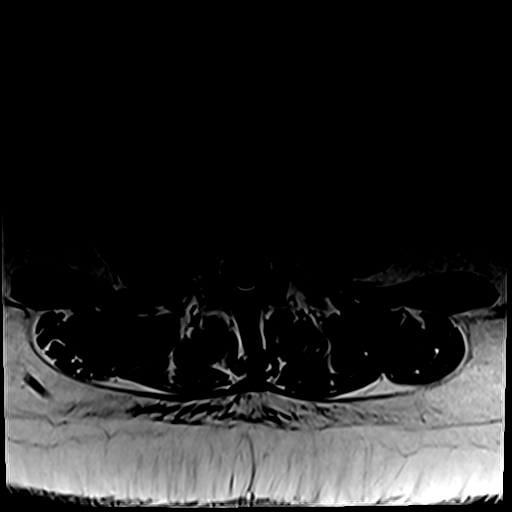
[im 23/40]
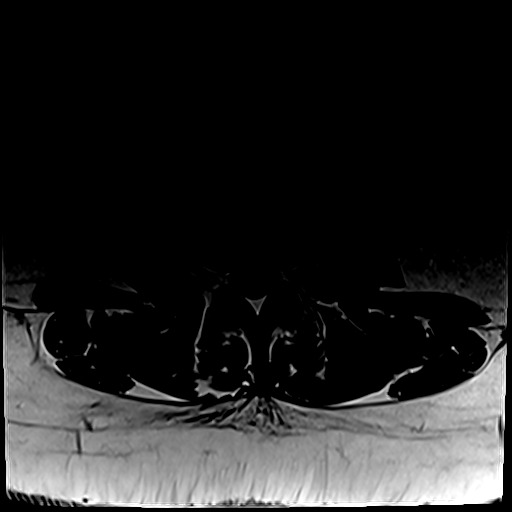
[im 28/40]
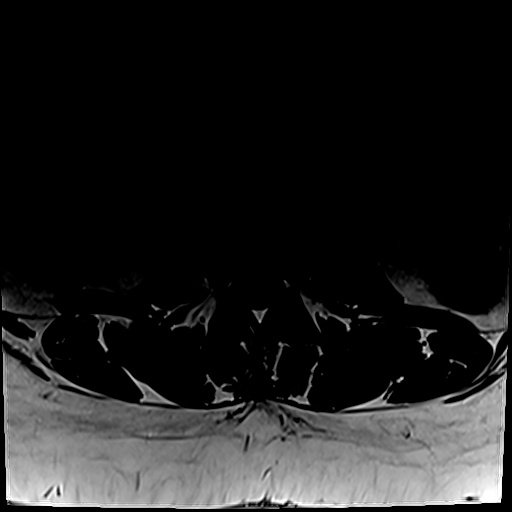
[im 34/40]
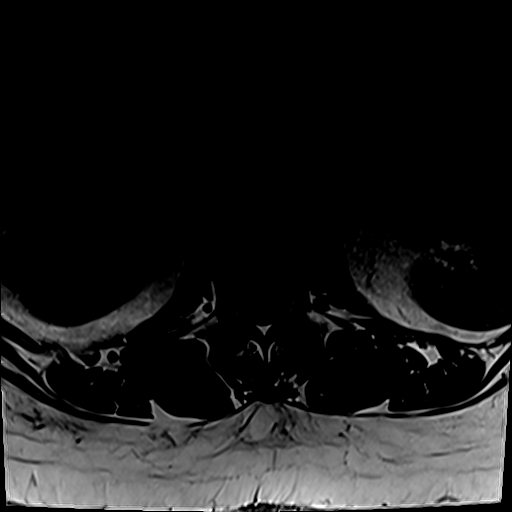
[im 40/40]
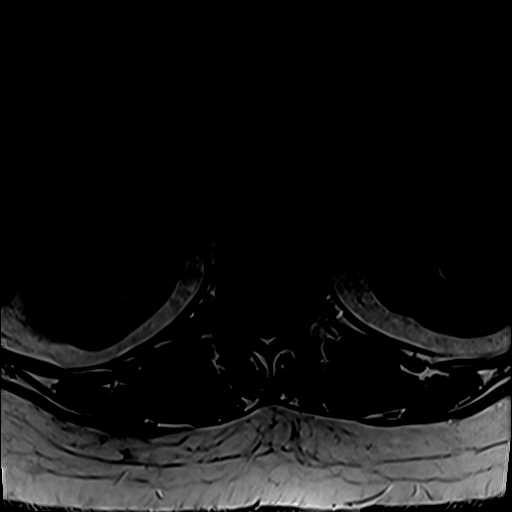

[31 of 48 positions shown; findings below may reference images not displayed]

FINDINGS: Segmentation: The lowest well developed disc space is designated as
L5-S1. Rudimentary ribs were noted on prior lumbar spine radiograph
at the T12 level.

Alignment:  Physiologic.

Vertebrae:  No fracture, evidence of discitis, or bone lesion.

Conus medullaris and cauda equina: Conus extends to the L1 level.
Conus and cauda equina appear normal.

Paraspinal and other soft tissues: Negative.

Disc levels:

T12-L1: Unremarkable.

L1-L2: Unremarkable.

L2-L3: Unremarkable.

L3-L4: Mild diffuse disc bulge with posterior annular fissure. Mild
bilateral facet hypertrophy. Mild bilateral subarticular recess
narrowing without canal stenosis. No foraminal stenosis.

L4-L5: Large posterior disc protrusion, slightly eccentric to the
left resulting in severe canal stenosis and severe bilateral
subarticular recess stenosis. Bilateral foramina are patent.

L5-S1: Moderate posterior disc protrusion resulting in moderate left
and mild right subarticular recess stenosis with mild canal
stenosis. Bilateral foramina are patent.
IMPRESSION: 1. Large posterior disc protrusion at L4-L5 resulting in severe
canal stenosis and severe bilateral subarticular recess stenosis.
2. Moderate posterior disc protrusion at L5-S1 resulting in moderate
left and mild right subarticular recess stenosis with mild canal
stenosis.
3. Mild bilateral subarticular recess narrowing at L3-L4.

## 2022-03-16 ENCOUNTER — Other Ambulatory Visit: Payer: Self-pay | Admitting: Neurosurgery

## 2022-03-16 DIAGNOSIS — M5126 Other intervertebral disc displacement, lumbar region: Secondary | ICD-10-CM
